# Patient Record
Sex: Male | Born: 1970
Health system: Southern US, Community
[De-identification: ages and names within clinical notes are randomized; demographics above are authoritative.]

## PROBLEM LIST (undated history)

## (undated) DIAGNOSIS — F329 Major depressive disorder, single episode, unspecified: Secondary | ICD-10-CM

## (undated) DIAGNOSIS — F419 Anxiety disorder, unspecified: Secondary | ICD-10-CM

## (undated) DIAGNOSIS — F32A Depression, unspecified: Secondary | ICD-10-CM

## (undated) DIAGNOSIS — J45909 Unspecified asthma, uncomplicated: Secondary | ICD-10-CM

## (undated) HISTORY — DX: Major depressive disorder, single episode, unspecified: F32.9

## (undated) HISTORY — DX: Anxiety disorder, unspecified: F41.9

## (undated) HISTORY — DX: Unspecified asthma, uncomplicated: J45.909

## (undated) HISTORY — DX: Depression, unspecified: F32.A

---

## 2013-10-06 ENCOUNTER — Ambulatory Visit: Payer: Self-pay | Admitting: Family Medicine

## 2013-10-06 VITALS — BP 120/66 | HR 97 | Temp 98.6°F | Resp 18 | Wt 145.0 lb

## 2013-10-06 DIAGNOSIS — S61319A Laceration without foreign body of unspecified finger with damage to nail, initial encounter: Secondary | ICD-10-CM

## 2013-10-06 DIAGNOSIS — S61209A Unspecified open wound of unspecified finger without damage to nail, initial encounter: Secondary | ICD-10-CM

## 2013-10-06 MED ORDER — CEPHALEXIN 500 MG PO CAPS: 500.0000 mg | ORAL_CAPSULE | Freq: Four times a day (QID) | ORAL | Status: DC

## 2013-10-06 NOTE — Progress Notes (Signed)
[facility]:  Office Visit  Chief Complaint:  Chief Complaint  Patient presents with  . Hand Pain    right middle finger pain    HPI: Dalton Peters is a 42 y.o. male who is here for right middle finger injury and possible wound infection, works in the Stage manager at [employer],  Staple gun went  into his nail bed, just on the top part and he pulled it out x 1 week. He denies any redness, welling, pain; No fevers or chills Noticed green coloration on cuticle and base of nail x 1 day and tried toclean it off but did not.  No drainage Tetanus UTD 2009  Past Medical History  Diagnosis Date  . Anxiety   . Depression    No past surgical history on file. History   Social History  . Marital Status: Divorced    Spouse Name: N/A    Number of Children: N/A  . Years of Education: N/A   Social History Main Topics  . Smoking status: Light Tobacco Smoker  . Smokeless tobacco: None  . Alcohol Use: Yes  . Drug Use: Yes    Special: Marijuana  . Sexual Activity: None   Other Topics Concern  . None   Social History Narrative  . None   No family history on file. No Known Allergies Prior to Admission medications   Medication Sig Start Date End Date Taking? Authorizing Provider  amphetamine-dextroamphetamine (ADDERALL XR) 20 MG 24 hr capsule Take 20 mg by mouth every morning.    Historical Provider, MD     ROS: The patient denies fevers, chills, night sweats, unintentional weight loss, chest pain, palpitations, wheezing, dyspnea on exertion, nausea, vomiting, abdominal pain, dysuria, hematuria, melena, numbness, weakness, or tingling.   All other systems have been reviewed and were otherwise negative with the exception of those mentioned in the HPI and as above.    PHYSICAL EXAM: Filed Vitals:   10/06/13 1617  BP: 120/66  Pulse: 97  Temp: 98.6 F (37 C)  Resp: 18   Filed Vitals:   10/06/13 1617  Weight: 145 lb (65.772 kg)   There is no height on file to  calculate BMI.  General: Alert, no acute distress HEENT:  Normocephalic, atraumatic, oropharynx patent. EOMI, PERRLA Cardiovascular:  Regular rate and rhythm, no rubs murmurs or gallops.  No Carotid bruits, radial pulse intact. No pedal edema.  Respiratory: Clear to auscultation bilaterally.  No wheezes, rales, or rhonchi.  No cyanosis, no use of accessory musculature GI: No organomegaly, abdomen is soft and non-tender, positive bowel sounds.  No masses. Skin: + right middle finger, + slightgreen tint bruise like coloration of cuticle, no e/o infection,there is a small pin point hole inderneath the area of interest,but no dc Neurologic: Facial musculature symmetric. Psychiatric: Patient is appropriate throughout our interaction. Lymphatic: No cervical lymphadenopathy Musculoskeletal: Gait intact. Neurovascularly intact: + radial pulse, good cap refill, + 5/5 strength deep and superficial tendons, sensation intact   LABS: No results found for this or any previous visit.   EKG/XRAY:   Primary read interpreted by Dr. Conley Rolls at [facility]   ASSESSMENT/PLAN: Encounter Diagnosis  Name Primary?  . Laceration of finger nail bed, initial encounter Yes   Scrubbed area clean, no xrays needed since he is neurovascularly intact, UTD on Tetanus No e/o current infection, may be a small resolving draining hematoma, there is a gap between nailbed and nail Will rx Keflexto use  if sxs worsen F/u  prn  Gross sideeffects, risk and benefits, and alternatives of medications d/w patient. Patient is aware that all medications have potential sideeffects and we are unable to predict every sideeffect or drug-drug interaction that may occur.  Hamilton Capri PHUONG, DO 10/06/2013 4:57 PM

## 2014-03-06 ENCOUNTER — Ambulatory Visit (INDEPENDENT_AMBULATORY_CARE_PROVIDER_SITE_OTHER): Payer: BC Managed Care – PPO | Admitting: Family Medicine

## 2014-03-06 VITALS — BP 122/74 | HR 103 | Temp 99.0°F | Resp 17 | Ht 67.5 in | Wt 149.0 lb

## 2014-03-06 DIAGNOSIS — F329 Major depressive disorder, single episode, unspecified: Secondary | ICD-10-CM

## 2014-03-06 DIAGNOSIS — F3289 Other specified depressive episodes: Secondary | ICD-10-CM

## 2014-03-06 DIAGNOSIS — F32A Depression, unspecified: Secondary | ICD-10-CM

## 2014-03-06 DIAGNOSIS — F640 Transsexualism: Secondary | ICD-10-CM

## 2014-03-06 DIAGNOSIS — F909 Attention-deficit hyperactivity disorder, unspecified type: Secondary | ICD-10-CM

## 2014-03-06 MED ORDER — BUPROPION HCL ER (XL) 150 MG PO TB24: 150.0000 mg | ORAL_TABLET | Freq: Every day | ORAL | Status: DC

## 2014-03-06 MED ORDER — AMPHETAMINE-DEXTROAMPHET ER 20 MG PO CP24: 20.0000 mg | ORAL_CAPSULE | ORAL | Status: DC

## 2014-03-06 NOTE — Progress Notes (Addendum)
Subjective:  Patient ID: [name], male    DOB: 12-Dec-1971, 43 y.o.   MRN: [id number] This chart was scribed for Shade Flood, MD by Marica Otter, ED Scribe at [facility]. This patient was seen in room Room 8 and the patient's care was started at 10:27AM.   Authored by Silas Sacramento, MD, unable to change in [facility].  PCP:  No PCP Per Patient  HPI HPI Comments: Dalton Peters is a 43 y.o. male, who presents to the [facility] requesting meds refill for Adderall XR and Wellbutrin.  Pt states that he has been on these meds on and off since the summer of 2010. Specifically, pt states that he discontinued use of his Wellbutrin approximately one year ago because he felt that the meds may be contributing to more mood swings/depressive symptoms. . Pt reports that since being off the meds, he has been having difficulty managing his daily activities, decrease in focus. Pt reports having fleeting thoughts of "if I was not here", however, denies making any plans or any specific intent regarding suicidal ideas. Pt further reports that  he has been contemplating transgender issues. Pt reports that recently he began to go by the name "Tab" which is short for Dalton Peters.    Pt denies palpitations, Hx of heart problems or family Hx of heart problems.   Plans on arranging counseling, pt reports he plans to reach out to the organization, [facility].    Pt is a smoker. Pt drinks 4-5 drinks per week. Pt reports he is a former marijuana user, now quit for a few weeks. Pt denies use of any other drugs.   Review of Systems  Constitutional: Negative for fatigue and unexpected weight change.  Eyes: Negative for visual disturbance.  Respiratory: Negative for cough, chest tightness and shortness of breath.   Cardiovascular: Negative for chest pain, palpitations and leg swelling.  Gastrointestinal: Negative for abdominal pain and blood in stool.  Neurological:  Negative for dizziness, light-headedness and headaches.  Psychiatric/Behavioral: Positive for suicidal ideas (fleeting thoughts of "if I was not here" but no specific intent or plan) and decreased concentration. Negative for self-injury.       Depression.    There are no active problems to display for this patient.  Past Medical History  Diagnosis Date   Anxiety    Depression    No past surgical history on file. No Known Allergies Prior to Admission medications   Medication Sig Start Date End Date Taking? Authorizing Provider  amphetamine-dextroamphetamine (ADDERALL XR) 20 MG 24 hr capsule Take 20 mg by mouth every morning.   Yes Historical Provider, MD   History   Social History   Marital Status: Divorced    Spouse Name: N/A    Number of Children: N/A   Years of Education: N/A   Occupational History   Not on file.   Social History Main Topics   Smoking status: Light Tobacco Smoker   Smokeless tobacco: Not on file   Alcohol Use: Yes   Drug Use: Yes    Special: Marijuana   Sexual Activity: Not on file   Other Topics Concern   Not on file   Social History Narrative   No narrative on file   Objective:  Physical Exam  Nursing note and vitals reviewed. Constitutional: He is oriented to person, place, and time. He appears well-developed and well-nourished.  HENT:  Head: Normocephalic and atraumatic.  Right  Ear: Tympanic membrane, external ear and ear canal normal.  Left Ear: Tympanic membrane, external ear and ear canal normal.  Nose: No rhinorrhea.  Mouth/Throat: Oropharynx is clear and moist and mucous membranes are normal. No oropharyngeal exudate or posterior oropharyngeal erythema.  Eyes: Conjunctivae are normal. Pupils are equal, round, and reactive to light.  Neck: Neck supple.  Cardiovascular: Normal rate, regular rhythm, normal heart sounds and intact distal pulses.   No murmur heard. Pulmonary/Chest: Effort normal and breath sounds normal. He  has no wheezes. He has no rhonchi. He has no rales.  Abdominal: Soft. There is no tenderness.  Lymphadenopathy:    He has no cervical adenopathy.  Neurological: He is alert and oriented to person, place, and time.  Skin: Skin is warm and dry. No rash noted.  Psychiatric: He has a normal mood and affect. His behavior is normal. Judgment and thought content normal.   Filed Vitals:   03/06/14 0932  BP: 122/74  Pulse: 103  Temp: 99 F (37.2 C)  TempSrc: Oral  Resp: 17  Height: 5' 7.5" (1.715 m)  Weight: 149 lb (67.586 kg)  SpO2: 98%   Controlled Substance Database Report: Last entered listing on May 27, 2013 for amphetamine salts 20 mg  #30.  Assessment & Plan:   Marchia Bondhomas M Frieze is a 43 y.o. male Depression - Plan: buPROPion (WELLBUTRIN XL) 150 MG 24 hr tablet restarted. May need higher dose.He plans to arrange therapist, and with gender identity disorder, and reported OCD type sx's - may need SSRi - higher dose, or psychiatrist to help with medication mgt including ADHD mgt, but will try prior regimen initially, and recheck in next 4-6 weeks.  RTC/ER precautions including any suicidal thoughts/plan with starting meds discussed.    Attention deficit hyperactivity disorder (ADHD) - restart Adderall if needed during week. Recheck next 4-6 weeks.  2 months meds rx.   Meds ordered this encounter  Medications   DISCONTD: amphetamine-dextroamphetamine (ADDERALL XR) 20 MG 24 hr capsule    Sig: Take 1 capsule (20 mg total) by mouth every morning.    Dispense:  30 capsule    Refill:  0   amphetamine-dextroamphetamine (ADDERALL XR) 20 MG 24 hr capsule    Sig: Take 1 capsule (20 mg total) by mouth every morning.    Dispense:  30 capsule    Refill:  0    May fill 30 days after date on prescription.   buPROPion (WELLBUTRIN XL) 150 MG 24 hr tablet    Sig: Take 1 tablet (150 mg total) by mouth daily.    Dispense:  30 tablet    Refill:  2   Patient Instructions  Start wellbutrin at  150mg  each day.  We may need to increase this, but can discuss at follow up.  If any increased depression, or any suicide thoughts - return here or emergency room immediately. Adderall in the morning during week if needed for ADD.  Arrange therapist appointment as discussed, but et me know if other names needed beside [facility]. Return to the clinic or go to the nearest emergency room if any of your symptoms worsen or new symptoms occur. Recheck in next 4-6 weeks.   [facility] Policy for Prescribing Controlled Substances (Revised 10/2012) 1. Prescriptions for controlled substances will be filled by ONE provider at [facility] with whom you have established and developed a plan for your care, including follow-up. 2. You are encouraged to schedule an appointment with your prescriber at our appointment  center for follow-up visits whenever possible. 3. If you request a prescription for the controlled substance while at [facility] for an acute problem (with someone other than your regular prescriber), you MAY be given a ONE-TIME prescription for a 30-day supply of the controlled substance, to allow time for you to return to see your regular prescriber for additional prescriptions.    I personally performed the services described in this documentation, which was scribed in my presence. The recorded information has been reviewed and considered, and addended by me as needed.

## 2014-03-06 NOTE — Patient Instructions (Signed)
Start wellbutrin at 150mg  each day.  We may need to increase this, but can discuss at follow up.  If any increased depression, or any suicide thoughts - return here or emergency room immediately. Adderall in the morning during week if needed for ADD.  Arrange therapist appointment as discussed, but et me know if other names needed beside [facility]. Return to the clinic or go to the nearest emergency room if any of your symptoms worsen or new symptoms occur. Recheck in next 4-6 weeks.   [facility] Policy for Prescribing Controlled Substances (Revised 10/2012) 1. Prescriptions for controlled substances will be filled by ONE provider at [facility] with whom you have established and developed a plan for your care, including follow-up. 2. You are encouraged to schedule an appointment with your prescriber at our appointment center for follow-up visits whenever possible. 3. If you request a prescription for the controlled substance while at [facility] for an acute problem (with someone other than your regular prescriber), you MAY be given a ONE-TIME prescription for a 30-day supply of the controlled substance, to allow time for you to return to see your regular prescriber for additional prescriptions.

## 2014-03-15 NOTE — Progress Notes (Signed)
Dr. Neva SeatGreene schedule is full,  Will let Selena BattenKim schedule this appointment

## 2014-03-24 ENCOUNTER — Telehealth: Payer: Self-pay

## 2014-03-24 NOTE — Telephone Encounter (Signed)
Patient called stated he seen Dr. Neva SeatGreene. He is requesting to change his Adderall to the instant release instead of the extended release.

## 2014-06-19 ENCOUNTER — Other Ambulatory Visit: Payer: Self-pay | Admitting: Family Medicine

## 2014-06-23 ENCOUNTER — Ambulatory Visit (INDEPENDENT_AMBULATORY_CARE_PROVIDER_SITE_OTHER): Payer: BC Managed Care – PPO | Admitting: Family Medicine

## 2014-06-23 VITALS — BP 108/60 | HR 80 | Temp 98.1°F | Resp 16 | Ht 66.25 in | Wt 134.4 lb

## 2014-06-23 DIAGNOSIS — F988 Other specified behavioral and emotional disorders with onset usually occurring in childhood and adolescence: Secondary | ICD-10-CM

## 2014-06-23 DIAGNOSIS — L299 Pruritus, unspecified: Secondary | ICD-10-CM

## 2014-06-23 MED ORDER — AMPHETAMINE-DEXTROAMPHETAMINE 20 MG PO TABS: ORAL_TABLET | ORAL | Status: DC

## 2014-06-23 MED ORDER — TRIAMCINOLONE ACETONIDE 0.1 % EX CREA: 1.0000 "application " | TOPICAL_CREAM | Freq: Two times a day (BID) | CUTANEOUS | Status: DC

## 2014-06-23 MED ORDER — AMPHETAMINE-DEXTROAMPHETAMINE 20 MG PO TABS: 20.0000 mg | ORAL_TABLET | Freq: Every day | ORAL | Status: DC

## 2014-06-23 NOTE — Progress Notes (Signed)
[facility] [address], [place] [address] [phone]  Date:  06/23/2014   Name:  Dalton Peters   DOB:  1971/01/18   MRN:  [id number]  PCP:  No PCP Per Patient    Chief Complaint: Medication and Rash   History of Present Illness:  Dalton Peters is a [age] y.o. very pleasant male patient who presents with the following:  Needs adderall refill and to discuss a rash.  He would like to change his adderall- he had tried the XR, but felt that he did better on the regular release in the past.  He would like to go back to regular if possible.  He had taken 20mg  IR in the morning, with an occasional afternoon second dose depending on his activities   Also, he has a rash that he noted 2-3 days ago.  He first noted it on his left trunk, and now has some on his arms and face as well. It is mostly on his waist, crotch, and his nose/ neck/tiny spots on his arms.  He thinks it is likely PI.  He is using OTC itch and "ivyrest" creams.    Otherwise he is ok today, generally healthy   He has done a little yard work.    Patient Active Problem List   Diagnosis Date Noted  . Depression 03/06/2014  . Attention deficit hyperactivity disorder (ADHD) 03/06/2014  . Gender identity disorder in adult 03/06/2014    Past Medical History  Diagnosis Date  . Anxiety   . Depression     No past surgical history on file.  History  Substance Use Topics  . Smoking status: Former Smoker    Quit date: 10/22/2013  . Smokeless tobacco: Not on file  . Alcohol Use: 1.0 - 1.5 oz/week    2-3 drink(s) per week    History reviewed. No pertinent family history.  No Known Allergies  Medication list has been reviewed and updated.  Current Outpatient Prescriptions on File Prior to Visit  Medication Sig Dispense Refill  . amphetamine-dextroamphetamine (ADDERALL XR) 20 MG 24 hr capsule Take 1 capsule (20 mg total) by mouth every morning.  30 capsule  0  . buPROPion (WELLBUTRIN XL) 150  MG 24 hr tablet Take 1 tablet (150 mg total) by mouth daily. PATIENT NEEDS OFFICE VISIT FOR ADDITIONAL REFILLS - OVERDUE FOR FOLLOW UP.  30 tablet  0   No current facility-administered medications on file prior to visit.    Review of Systems:  As per HPI- otherwise negative.   Physical Examination: Filed Vitals:   06/23/14 1438  BP: 108/60  Pulse: 80  Temp: 98.1 F (36.7 C)  Resp: 16   Filed Vitals:   06/23/14 1438  Height: 5' 6.25" (1.683 m)  Weight: 134 lb 6.4 oz (60.963 kg)   Body mass index is 21.52 kg/(m^2). Ideal Body Weight: Weight in (lb) to have BMI = 25: 155.7  GEN: WDWN, NAD, Non-toxic, A & O x 3, slim, looks well HEENT: Atraumatic, Normocephalic. Neck supple. No masses, No LAD. Ears and Nose: No external deformity. CV: RRR, No M/G/R. No JVD. No thrill. No extra heart sounds. PULM: CTA B, no wheezes, crackles, rhonchi. No retractions. No resp. distress. No accessory muscle use. EXTR: No c/c/e NEURO Normal gait.  PSYCH: Normally interactive. Conversant. Not depressed or anxious appearing.  Calm demeanor.  He has mild rhus dermatitis over his left waistline, neck and chin, and a tiny spot on his  nose  Assessment and Plan: ADD (attention deficit disorder) - Plan: amphetamine-dextroamphetamine (ADDERALL) 20 MG tablet, amphetamine-dextroamphetamine (ADDERALL) 20 MG tablet, amphetamine-dextroamphetamine (ADDERALL) 20 MG tablet  Itching - Plan: triamcinolone cream (KENALOG) 0.1 %  Changed his adderall to 20mg  IR, gave #40 for a month so he can take a 2nd dose on occasion if needed Discussed treatment for PI.  He is not eager to take steroids and I agree topicals should be sufficient.  Will treat with triamcinoone, he will let me know if his sx get worse and he needs oral pred.  He will let me know if the new adderall regimen is not satisfactory   Signed Abbe AmsterdamJessica Conita Amenta, MD

## 2014-06-23 NOTE — Patient Instructions (Signed)
Let's try changing to adderall regular release 20mg , you can take a 2nd dose later in the day as needed.    Use the triamcinolone cream on your rash.  You can also try zyrtec or claritin, and benadryl at night as needed.  If your symptoms get worse and you need prednisone call me

## 2014-07-18 ENCOUNTER — Other Ambulatory Visit: Payer: Self-pay | Admitting: Family Medicine

## 2014-07-18 DIAGNOSIS — F329 Major depressive disorder, single episode, unspecified: Secondary | ICD-10-CM

## 2014-07-18 DIAGNOSIS — F32A Depression, unspecified: Secondary | ICD-10-CM

## 2014-07-19 NOTE — Telephone Encounter (Signed)
Dr Patsy Lageropland, I put note on last RF that pt needed OV for more. He came in and saw you on 06/23/14 but didn't discuss depression that I can see. Do you want to give RFs or RTC?

## 2014-10-09 ENCOUNTER — Ambulatory Visit (INDEPENDENT_AMBULATORY_CARE_PROVIDER_SITE_OTHER): Payer: BC Managed Care – PPO | Admitting: Emergency Medicine

## 2014-10-09 VITALS — BP 112/66 | HR 77 | Temp 98.5°F | Resp 18 | Ht 67.5 in | Wt 144.0 lb

## 2014-10-09 DIAGNOSIS — F909 Attention-deficit hyperactivity disorder, unspecified type: Secondary | ICD-10-CM

## 2014-10-09 DIAGNOSIS — M79622 Pain in left upper arm: Secondary | ICD-10-CM

## 2014-10-09 DIAGNOSIS — F988 Other specified behavioral and emotional disorders with onset usually occurring in childhood and adolescence: Secondary | ICD-10-CM

## 2014-10-09 MED ORDER — AMPHETAMINE-DEXTROAMPHETAMINE 20 MG PO TABS: 20.0000 mg | ORAL_TABLET | Freq: Every day | ORAL | Status: DC

## 2014-10-09 MED ORDER — NAPROXEN SODIUM 550 MG PO TABS: 550.0000 mg | ORAL_TABLET | Freq: Two times a day (BID) | ORAL | Status: AC

## 2014-10-09 NOTE — Progress Notes (Signed)
[facility] [address], [place] [address] [phone]  Date:  10/09/2014   Name:  Dalton Peters Rasp   DOB:  18-Nov-1971   MRN:  [id number]  PCP:  No PCP Per Patient    Chief Complaint: Arm Pain   History of Present Illness:  Dalton Peters Dalton Peters is a 43 y.o. very pleasant male patient who presents with the following:  Has pain in left upper arm for the past 6 months.  Pain is intermittent and has been more or less constant for the past two weeks with increase use at work.   No numbness, tingling or neuro symptoms No history of injury. Pain in biceps, not in shoulder or elbow. No radiation. History of bursitis in shoulder and this is "different". No improvement with over the counter medications or other home remedies. Denies other complaint or health concern today.   Patient Active Problem List   Diagnosis Date Noted  . Depression 03/06/2014  . Attention deficit hyperactivity disorder (ADHD) 03/06/2014  . Gender identity disorder in adult 03/06/2014    Past Medical History  Diagnosis Date  . Anxiety   . Depression     History reviewed. No pertinent past surgical history.  History  Substance Use Topics  . Smoking status: Former Smoker    Quit date: 10/22/2013  . Smokeless tobacco: Not on file  . Alcohol Use: 1.0 - 1.5 oz/week    2-3 drink(s) per week    History reviewed. No pertinent family history.  No Known Allergies  Medication list has been reviewed and updated.  Current Outpatient Prescriptions on File Prior to Visit  Medication Sig Dispense Refill  . amphetamine-dextroamphetamine (ADDERALL) 20 MG tablet Take 1 tablet (20 mg total) by mouth daily. Take a second dose in the afternoon if needed  40 tablet  0  . buPROPion (WELLBUTRIN XL) 150 MG 24 hr tablet Take 1 tablet (150 mg total) by mouth daily.  30 tablet  2  . amphetamine-dextroamphetamine (ADDERALL) 20 MG tablet Take 1 tablet by mouth daily.  Take a second dose in the afternoon  if needed.  Ok to fill 07/23/2014  40 tablet  0  . amphetamine-dextroamphetamine (ADDERALL) 20 MG tablet Take 1 tablet by mouth daily.  Take a second dose in the afternoon if needed.  Ok to fill 08/22/2014  40 tablet  0  . triamcinolone cream (KENALOG) 0.1 % Apply 1 application topically 2 (two) times daily.  60 g  0   No current facility-administered medications on file prior to visit.    Review of Systems:   As per HPI, otherwise negative.    Physical Examination: Filed Vitals:   10/09/14 1613  BP: 112/66  Pulse: 77  Temp: 98.5 F (36.9 C)  Resp: 18   Filed Vitals:   10/09/14 1613  Height: 5' 7.5" (1.715 Peters)  Weight: 144 lb (65.318 kg)   Body mass index is 22.21 kg/(Peters^2). Ideal Body Weight: Weight in (lb) to have BMI = 25: 161.7   GEN: WDWN, NAD, Non-toxic, Alert & Oriented x 3 HEENT: Atraumatic, Normocephalic.  Ears and Nose: No external deformity. EXTR: No clubbing/cyanosis/edema NEURO: Normal gait.  PSYCH: Normally interactive. Conversant. Not depressed or anxious appearing.  Calm demeanor.  LEFT upper ext:  Full painless AROM shoulder and elbow.  No tenderness shoulder, upper arm or elbow.   Assessment and Plan: Cervical neuritis vs muscle strain Anaprox  Signed,  Phillips OdorJeffery Taraann Olthoff, MD

## 2014-10-09 NOTE — Addendum Note (Signed)
Addended by: Carmelina DaneANDERSON, Payton Prinsen S on: 10/09/2014 04:29 PM   Modules accepted: Orders

## 2014-10-21 ENCOUNTER — Other Ambulatory Visit: Payer: Self-pay | Admitting: Family Medicine

## 2014-11-07 ENCOUNTER — Ambulatory Visit (INDEPENDENT_AMBULATORY_CARE_PROVIDER_SITE_OTHER): Payer: BC Managed Care – PPO | Admitting: Family Medicine

## 2014-11-07 ENCOUNTER — Telehealth: Payer: Self-pay | Admitting: Urgent Care

## 2014-11-07 ENCOUNTER — Ambulatory Visit (INDEPENDENT_AMBULATORY_CARE_PROVIDER_SITE_OTHER): Payer: BC Managed Care – PPO

## 2014-11-07 VITALS — BP 104/70 | HR 82 | Temp 97.7°F | Resp 16 | Ht 66.0 in | Wt 142.0 lb

## 2014-11-07 DIAGNOSIS — F32A Depression, unspecified: Secondary | ICD-10-CM

## 2014-11-07 DIAGNOSIS — F329 Major depressive disorder, single episode, unspecified: Secondary | ICD-10-CM

## 2014-11-07 DIAGNOSIS — S93509A Unspecified sprain of unspecified toe(s), initial encounter: Secondary | ICD-10-CM

## 2014-11-07 DIAGNOSIS — M25511 Pain in right shoulder: Secondary | ICD-10-CM

## 2014-11-07 DIAGNOSIS — S99922A Unspecified injury of left foot, initial encounter: Secondary | ICD-10-CM

## 2014-11-07 DIAGNOSIS — F419 Anxiety disorder, unspecified: Secondary | ICD-10-CM

## 2014-11-07 MED ORDER — BUPROPION HCL ER (SR) 150 MG PO TB12: 150.0000 mg | ORAL_TABLET | Freq: Two times a day (BID) | ORAL | Status: DC

## 2014-11-07 MED ORDER — MELOXICAM 15 MG PO TABS: 15.0000 mg | ORAL_TABLET | Freq: Every day | ORAL | Status: DC

## 2014-11-07 NOTE — Progress Notes (Signed)
MRN: [id number] DOB: Jul 18, 1971  Subjective:   Dalton Peters is a 43 y.o. male presenting for foot injury and mental fog.  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 Patient wears working boots hit feet at an angle, left foot Peters, rolled right foot. Also hit left elbow, feels like he strained his right shoulder trying to catch himself on the way down. Patient was able to walk immediately afterwards without pain. Mild - moderate pain in his left 2nd toe started a couple of hours later, relieved some with Naproxen. Had some redness/purple discoloration last night, improved today. Denies bleeding, frank swelling. Reports good ROM, denies loss of sensation, not tender to touch. Patient is really worried that he has a broken toe, states he broke same toe years ago in right foot and feels exactly the same as this. Also, elevated right ankle, used ice, some soreness in right ankle. Reports right shoulder has limited range of motion and pain. No deformity, swelling or erythema for ankle or shoulder.  Feels like mental fog in the last week, having a lot of anxiety, depression due to finances, paying bills. Reports being on Wellbutrin since 02/2014 for ADHD, takes Adderall intermittently for days when he is really busy up to 20mg  in a day. Sleep is "okay", does not have regular work schedule, sleep is inconsistent, tries Melatonin to help with this. Has difficulty concentrating, feels agitated/restless, appetite has been good, admits passive thoughts about death, "he'd be 'okay' if he got hit by a bus" but no SI or HI. Denies a history of seizures or adverse effects with Wellbutrin. Denies fevers, chills, n/v, diarrhea, weight loss, fatigue, constipation. Denies smoking, smoked marijuana 2 weeks ago, has phases with this not using as much lately, does not plan on smoking marijuana again since he is trying to get a new  job, drinks alcohol occasionally.  Denies any other aggravating or relieving factors, no other questions or concerns.   Prior to Admission medications   Medication Sig Start Date End Date Taking? Authorizing Provider  amphetamine-dextroamphetamine (ADDERALL) 20 MG tablet Take 1 tablet by mouth daily.  Take a second dose in the afternoon if needed.  Ok to fill 07/23/2014 06/23/14  Yes Jessica C Copland, MD  amphetamine-dextroamphetamine (ADDERALL) 20 MG tablet Take 1 tablet by mouth daily.  Take a second dose in the afternoon if needed.  Ok to fill 08/22/2014 06/23/14  Yes Jessica C Copland, MD  amphetamine-dextroamphetamine (ADDERALL) 20 MG tablet Take 1 tablet (20 mg total) by mouth daily. Take a second dose in the afternoon if needed 10/09/14  Yes Carmelina DaneJeffery S Anderson, MD  buPROPion (WELLBUTRIN XL) 150 MG 24 hr tablet TAKE 1 TABLET (150 MG TOTAL) BY MOUTH DAILY. 10/23/14  Yes Gwenlyn FoundJessica C Copland, MD  naproxen sodium (ANAPROX DS) 550 MG tablet Take 1 tablet (550 mg total) by mouth 2 (two) times daily with a meal. 10/09/14 10/09/15 Yes Carmelina DaneJeffery S Anderson, MD  triamcinolone cream (KENALOG) 0.1 % Apply 1 application topically 2 (two) times daily. 06/23/14   Pearline CablesJessica C Copland, MD    No Known Allergies   Past Medical History  Diagnosis Date  . Anxiety   . Depression     History reviewed. No pertinent past surgical history.   ROS As in subjective.   Objective:   Vitals: BP 104/70 mmHg  Pulse 82  Temp(Src) 97.7 F (36.5 C) (Oral)  Resp 16  Ht 5\' 6"  (1.676 m)  Wt 142 lb (64.411 kg)  BMI 22.93 kg/m2  SpO2 98%  Physical Exam  Constitutional: He is oriented to person, place, and time and well-developed, well-nourished, and in no distress. No distress.  Neck: Normal range of motion. Neck supple. No thyromegaly present.  Cardiovascular: Normal rate, regular rhythm, normal heart sounds and intact distal pulses.  Exam reveals no gallop and no friction rub.   No murmur heard. Pulmonary/Chest:  Effort normal and breath sounds normal. No stridor. No respiratory distress. He has no wheezes. He has no rales. He exhibits no tenderness.  Abdominal: Soft. Bowel sounds are normal. He exhibits no distension and no mass. There is no tenderness.  Musculoskeletal: He exhibits edema and tenderness.       Right shoulder: He exhibits decreased range of motion and pain (Positive Hawkins and Neers). He exhibits no bony tenderness, no swelling and no deformity.       Left ankle: He exhibits decreased range of motion (left 2nd toe), swelling (left 2nd toe) and ecchymosis (mild over left 2nd toe). Tenderness (left 2nd toe).  Neurological: He is alert and oriented to person, place, and time.  Skin: Skin is warm and dry. No rash noted. He is not diaphoretic. No erythema.   [facility] reading (PRIMARY) by  Dr. Patsy Lageropland and PA-Marcques Wrightsman. [facility]. Unable to enhance image or zoom in. Suspect non-displaced, closed proximal fracture of proximal phalange in 2nd left toe. No other bony abnormalities, soft tissue unremarkable.   Assessment and Plan :   1. Toe fracture, left, closed, initial encounter 2. Toe injury, left, initial encounter - Immobilization through buddy tape of 2nd and 3rd left toe, advised patient to continue this for 2 weeks since pain is very minimal and is ambulating very well. Advised return to clinic if symptoms worsen, fail to resolve or as needed.  - DG Foot 2 Views Left; Future - meloxicam (MOBIC) 15 MG tablet; Take 1 tablet (15 mg total) by mouth daily.  Dispense: 30 tablet; Refill: 1  3. Pain in joint, shoulder region, right Shoulder impingement, possible shoulder strain, advised shoulder and stretching exercises, Meloxicam for pain, return to clinic if symptoms worsen, fail to resolve or as needed - meloxicam (MOBIC) 15 MG tablet; Take 1 tablet (15 mg total) by mouth daily.  Dispense: 30 tablet; Refill: 1  4. Depression 5. Anxiety - Changed and increased from  Wellbutrin XL 150mg  QD to 150mg  Wellbutrin SR BID, follow up in 4 weeks. Advised patient to call and request my schedule during 1st week of December for follow up. - buPROPion (WELLBUTRIN SR) 150 MG 12 hr tablet; Take 1 tablet (150 mg total) by mouth 2 (two) times daily.  Dispense: 60 tablet; Refill: 1   Wallis BambergMario Martese Vanatta, PA-C [facility] [phone] 11/07/2014 10:51 AM

## 2014-11-07 NOTE — Patient Instructions (Signed)
Toe Fracture Your caregiver has diagnosed you as having a fractured toe. A toe fracture is a break in the bone of a toe. "Buddy taping" is a way of splinting your broken toe, by taping the broken toe to the toe next to it. This "buddy taping" will keep the injured toe from moving beyond normal range of motion. Buddy taping also helps the toe heal in a more normal alignment. It may take 6 to 8 weeks for the toe injury to heal. HOME CARE INSTRUCTIONS   Leave your toes taped together for as long as directed by your caregiver or until you see a doctor for a follow-up examination. You can change the tape after bathing. Always use a small piece of gauze or cotton between the toes when taping them together. This will help the skin stay dry and prevent infection.  Apply ice to the injury for 15-20 minutes each hour while awake for the first 2 days. Put the ice in a plastic bag and place a towel between the bag of ice and your skin.  After the first 2 days, apply heat to the injured area. Use heat for the next 2 to 3 days. Place a heating pad on the foot or soak the foot in warm water as directed by your caregiver.  Keep your foot elevated as much as possible to lessen swelling.  Wear sturdy, supportive shoes. The shoes should not pinch the toes or fit tightly against the toes.  Your caregiver may prescribe a rigid shoe if your foot is very swollen.  Your may be given crutches if the pain is too great and it hurts too much to walk.  Only take over-the-counter or prescription medicines for pain, discomfort, or fever as directed by your caregiver.  If your caregiver has given you a follow-up appointment, it is very important to keep that appointment. Not keeping the appointment could result in a chronic or permanent injury, pain, and disability. If there is any problem keeping the appointment, you must call back to this facility for assistance. SEEK MEDICAL CARE IF:   You have increased pain or swelling,  not relieved with medications.  The pain does not get better after 1 week.  Your injured toe is cold when the others are warm. SEEK IMMEDIATE MEDICAL CARE IF:   The toe becomes cold, numb, or white.  The toe becomes hot (inflamed) and red. Document Released: 12/05/2000 Document Revised: 03/01/2012 Document Reviewed: 07/24/2008 [facility] Patient Information 2015 [place], [place]. This information is not intended to replace advice given to you by your health care provider. Make sure you discuss any questions you have with your health care provider.    Perform shoulder exercises and stretches twice a day, 5 sets of 10 repetitions. Take Meloxicam for pain.    Impingement Syndrome, Rotator Cuff, Bursitis with Rehab Impingement syndrome is a condition that involves inflammation of the tendons of the rotator cuff and the subacromial bursa, that causes pain in the shoulder. The rotator cuff consists of four tendons and muscles that control much of the shoulder and upper arm function. The subacromial bursa is a fluid filled sac that helps reduce friction between the rotator cuff and one of the bones of the shoulder (acromion). Impingement syndrome is usually an overuse injury that causes swelling of the bursa (bursitis), swelling of the tendon (tendonitis), and/or a tear of the tendon (strain). Strains are classified into three categories. Grade 1 strains cause pain, but the tendon is not lengthened. Grade  2 strains include a lengthened ligament, due to the ligament being stretched or partially ruptured. With grade 2 strains there is still function, although the function may be decreased. Grade 3 strains include a complete tear of the tendon or muscle, and function is usually impaired. SYMPTOMS   Pain around the shoulder, often at the outer portion of the upper arm.  Pain that gets worse with shoulder function, especially when reaching overhead or lifting.  Sometimes, aching when not using the  arm.  Pain that wakes you up at night.  Sometimes, tenderness, swelling, warmth, or redness over the affected area.  Loss of strength.  Limited motion of the shoulder, especially reaching behind the back (to the back pocket or to unhook bra) or across your body.  Crackling sound (crepitation) when moving the arm.  Biceps tendon pain and inflammation (in the front of the shoulder). Worse when bending the elbow or lifting. CAUSES  Impingement syndrome is often an overuse injury, in which chronic (repetitive) motions cause the tendons or bursa to become inflamed. A strain occurs when a force is paced on the tendon or muscle that is greater than it can withstand. Common mechanisms of injury include: Stress from sudden increase in duration, frequency, or intensity of training.  Direct hit (trauma) to the shoulder.  Aging, erosion of the tendon with normal use.  Bony bump on shoulder (acromial spur). RISK INCREASES WITH:  Contact sports (football, wrestling, boxing).  Throwing sports (baseball, tennis, volleyball).  Weightlifting and bodybuilding.  Heavy labor.  Previous injury to the rotator cuff, including impingement.  Poor shoulder strength and flexibility.  Failure to warm up properly before activity.  Inadequate protective equipment.  Old age.  Bony bump on shoulder (acromial spur). PREVENTION   Warm up and stretch properly before activity.  Allow for adequate recovery between workouts.  Maintain physical fitness:  Strength, flexibility, and endurance.  Cardiovascular fitness.  Learn and use proper exercise technique. PROGNOSIS  If treated properly, impingement syndrome usually goes away within 6 weeks. Sometimes surgery is required.  RELATED COMPLICATIONS   Longer healing time if not properly treated, or if not given enough time to heal.  Recurring symptoms, that result in a chronic condition.  Shoulder stiffness, frozen shoulder, or loss of  motion.  Rotator cuff tendon tear.  Recurring symptoms, especially if activity is resumed too soon, with overuse, with a direct blow, or when using poor technique. TREATMENT  Treatment first involves the use of ice and medicine, to reduce pain and inflammation. The use of strengthening and stretching exercises may help reduce pain with activity. These exercises may be performed at home or with a therapist. If non-surgical treatment is unsuccessful after more than 6 months, surgery may be advised. After surgery and rehabilitation, activity is usually possible in 3 months.  MEDICATION  If pain medicine is needed, nonsteroidal anti-inflammatory medicines (aspirin and ibuprofen), or other minor pain relievers (acetaminophen), are often advised.  Do not take pain medicine for 7 days before surgery.  Prescription pain relievers may be given, if your caregiver thinks they are needed. Use only as directed and only as much as you need.  Corticosteroid injections may be given by your caregiver. These injections should be reserved for the most serious cases, because they may only be given a certain number of times. HEAT AND COLD  Cold treatment (icing) should be applied for 10 to 15 minutes every 2 to 3 hours for inflammation and pain, and immediately after activity that aggravates  your symptoms. Use ice packs or an ice massage.  Heat treatment may be used before performing stretching and strengthening activities prescribed by your caregiver, physical therapist, or athletic trainer. Use a heat pack or a warm water soak. SEEK MEDICAL CARE IF:   Symptoms get worse or do not improve in 4 to 6 weeks, despite treatment.  New, unexplained symptoms develop. (Drugs used in treatment may produce side effects.) EXERCISES  RANGE OF MOTION (ROM) AND STRETCHING EXERCISES - Impingement Syndrome (Rotator Cuff  Tendinitis, Bursitis) These exercises may help you when beginning to rehabilitate your injury. Your  symptoms may go away with or without further involvement from your physician, physical therapist or athletic trainer. While completing these exercises, remember:   Restoring tissue flexibility helps normal motion to return to the joints. This allows healthier, less painful movement and activity.  An effective stretch should be held for at least 30 seconds.  A stretch should never be painful. You should only feel a gentle lengthening or release in the stretched tissue. STRETCH - Flexion, Standing  Stand with good posture. With an underhand grip on your right / left hand, and an overhand grip on the opposite hand, grasp a broomstick or cane so that your hands are a little more than shoulder width apart.  Keeping your right / left elbow straight and shoulder muscles relaxed, push the stick with your opposite hand, to raise your right / left arm in front of your body and then overhead. Raise your arm until you feel a stretch in your right / left shoulder, but before you have increased shoulder pain.  Try to avoid shrugging your right / left shoulder as your arm rises, by keeping your shoulder blade tucked down and toward your mid-back spine. Hold for __________ seconds.  Slowly return to the starting position. Repeat __________ times. Complete this exercise __________ times per day. STRETCH - Abduction, Supine  Lie on your back. With an underhand grip on your right / left hand and an overhand grip on the opposite hand, grasp a broomstick or cane so that your hands are a little more than shoulder width apart.  Keeping your right / left elbow straight and your shoulder muscles relaxed, push the stick with your opposite hand, to raise your right / left arm out to the side of your body and then overhead. Raise your arm until you feel a stretch in your right / left shoulder, but before you have increased shoulder pain.  Try to avoid shrugging your right / left shoulder as your arm rises, by keeping  your shoulder blade tucked down and toward your mid-back spine. Hold for __________ seconds.  Slowly return to the starting position. Repeat __________ times. Complete this exercise __________ times per day. ROM - Flexion, Active-Assisted  Lie on your back. You may bend your knees for comfort.  Grasp a broomstick or cane so your hands are about shoulder width apart. Your right / left hand should grip the end of the stick, so that your hand is positioned "thumbs-up," as if you were about to shake hands.  Using your healthy arm to lead, raise your right / left arm overhead, until you feel a gentle stretch in your shoulder. Hold for __________ seconds.  Use the stick to assist in returning your right / left arm to its starting position. Repeat __________ times. Complete this exercise __________ times per day.  ROM - Internal Rotation, Supine   Lie on your back on a firm surface.  Place your right / left elbow about 60 degrees away from your side. Elevate your elbow with a folded towel, so that the elbow and shoulder are the same height.  Using a broomstick or cane and your strong arm, pull your right / left hand toward your body until you feel a gentle stretch, but no increase in your shoulder pain. Keep your shoulder and elbow in place throughout the exercise.  Hold for __________ seconds. Slowly return to the starting position. Repeat __________ times. Complete this exercise __________ times per day. STRETCH - Internal Rotation  Place your right / left hand behind your back, palm up.  Throw a towel or belt over your opposite shoulder. Grasp the towel with your right / left hand.  While keeping an upright posture, gently pull up on the towel, until you feel a stretch in the front of your right / left shoulder.  Avoid shrugging your right / left shoulder as your arm rises, by keeping your shoulder blade tucked down and toward your mid-back spine.  Hold for __________ seconds. Release the  stretch, by lowering your healthy hand. Repeat __________ times. Complete this exercise __________ times per day. ROM - Internal Rotation   Using an underhand grip, grasp a stick behind your back with both hands.  While standing upright with good posture, slide the stick up your back until you feel a mild stretch in the front of your shoulder.  Hold for __________ seconds. Slowly return to your starting position. Repeat __________ times. Complete this exercise __________ times per day.  STRETCH - Posterior Shoulder Capsule   Stand or sit with good posture. Grasp your right / left elbow and draw it across your chest, keeping it at the same height as your shoulder.  Pull your elbow, so your upper arm comes in closer to your chest. Pull until you feel a gentle stretch in the back of your shoulder.  Hold for __________ seconds. Repeat __________ times. Complete this exercise __________ times per day. STRENGTHENING EXERCISES - Impingement Syndrome (Rotator Cuff Tendinitis, Bursitis) These exercises may help you when beginning to rehabilitate your injury. They may resolve your symptoms with or without further involvement from your physician, physical therapist or athletic trainer. While completing these exercises, remember:  Muscles can gain both the endurance and the strength needed for everyday activities through controlled exercises.  Complete these exercises as instructed by your physician, physical therapist or athletic trainer. Increase the resistance and repetitions only as guided.  You may experience muscle soreness or fatigue, but the pain or discomfort you are trying to eliminate should never worsen during these exercises. If this pain does get worse, stop and make sure you are following the directions exactly. If the pain is still present after adjustments, discontinue the exercise until you can discuss the trouble with your clinician.  During your recovery, avoid activity or  exercises which involve actions that place your injured hand or elbow above your head or behind your back or head. These positions stress the tissues which you are trying to heal. STRENGTH - Scapular Depression and Adduction   With good posture, sit on a firm chair. Support your arms in front of you, with pillows, arm rests, or on a table top. Have your elbows in line with the sides of your body.  Gently draw your shoulder blades down and toward your mid-back spine. Gradually increase the tension, without tensing the muscles along the top of your shoulders and the back of your neck.  Hold for __________ seconds. Slowly release the tension and relax your muscles completely before starting the next repetition.  After you have practiced this exercise, remove the arm support and complete the exercise in standing as well as sitting position. Repeat __________ times. Complete this exercise __________ times per day.  STRENGTH - Shoulder Abductors, Isometric  With good posture, stand or sit about 4-6 inches from a wall, with your right / left side facing the wall.  Bend your right / left elbow. Gently press your right / left elbow into the wall. Increase the pressure gradually, until you are pressing as hard as you can, without shrugging your shoulder or increasing any shoulder discomfort.  Hold for __________ seconds.  Release the tension slowly. Relax your shoulder muscles completely before you begin the next repetition. Repeat __________ times. Complete this exercise __________ times per day.  STRENGTH - External Rotators, Isometric  Keep your right / left elbow at your side and bend it 90 degrees.  Step into a door frame so that the outside of your right / left wrist can press against the door frame without your upper arm leaving your side.  Gently press your right / left wrist into the door frame, as if you were trying to swing the back of your hand away from your stomach. Gradually increase  the tension, until you are pressing as hard as you can, without shrugging your shoulder or increasing any shoulder discomfort.  Hold for __________ seconds.  Release the tension slowly. Relax your shoulder muscles completely before you begin the next repetition. Repeat __________ times. Complete this exercise __________ times per day.  STRENGTH - Supraspinatus   Stand or sit with good posture. Grasp a __________ weight, or an exercise band or tubing, so that your hand is "thumbs-up," like you are shaking hands.  Slowly lift your right / left arm in a "V" away from your thigh, diagonally into the space between your side and straight ahead. Lift your hand to shoulder height or as far as you can, without increasing any shoulder pain. At first, many people do not lift their hands above shoulder height.  Avoid shrugging your right / left shoulder as your arm rises, by keeping your shoulder blade tucked down and toward your mid-back spine.  Hold for __________ seconds. Control the descent of your hand, as you slowly return to your starting position. Repeat __________ times. Complete this exercise __________ times per day.  STRENGTH - External Rotators  Secure a rubber exercise band or tubing to a fixed object (table, pole) so that it is at the same height as your right / left elbow when you are standing or sitting on a firm surface.  Stand or sit so that the secured exercise band is at your uninjured side.  Bend your right / left elbow 90 degrees. Place a folded towel or small pillow under your right / left arm, so that your elbow is a few inches away from your side.  Keeping the tension on the exercise band, pull it away from your body, as if pivoting on your elbow. Be sure to keep your body steady, so that the movement is coming only from your rotating shoulder.  Hold for __________ seconds. Release the tension in a controlled manner, as you return to the starting position. Repeat __________  times. Complete this exercise __________ times per day.  STRENGTH - Internal Rotators   Secure a rubber exercise band or tubing to a fixed object (table, pole)  so that it is at the same height as your right / left elbow when you are standing or sitting on a firm surface.  Stand or sit so that the secured exercise band is at your right / left side.  Bend your elbow 90 degrees. Place a folded towel or small pillow under your right / left arm so that your elbow is a few inches away from your side.  Keeping the tension on the exercise band, pull it across your body, toward your stomach. Be sure to keep your body steady, so that the movement is coming only from your rotating shoulder.  Hold for __________ seconds. Release the tension in a controlled manner, as you return to the starting position. Repeat __________ times. Complete this exercise __________ times per day.  STRENGTH - Scapular Protractors, Standing   Stand arms length away from a wall. Place your hands on the wall, keeping your elbows straight.  Begin by dropping your shoulder blades down and toward your mid-back spine.  To strengthen your protractors, keep your shoulder blades down, but slide them forward on your rib cage. It will feel as if you are lifting the back of your rib cage away from the wall. This is a subtle motion and can be challenging to complete. Ask your caregiver for further instruction, if you are not sure you are doing the exercise correctly.  Hold for __________ seconds. Slowly return to the starting position, resting the muscles completely before starting the next repetition. Repeat __________ times. Complete this exercise __________ times per day. STRENGTH - Scapular Protractors, Supine  Lie on your back on a firm surface. Extend your right / left arm straight into the air while holding a __________ weight in your hand.  Keeping your head and back in place, lift your shoulder off the floor.  Hold for  __________ seconds. Slowly return to the starting position, and allow your muscles to relax completely before starting the next repetition. Repeat __________ times. Complete this exercise __________ times per day. STRENGTH - Scapular Protractors, Quadruped  Get onto your hands and knees, with your shoulders directly over your hands (or as close as you can be, comfortably).  Keeping your elbows locked, lift the back of your rib cage up into your shoulder blades, so your mid-back rounds out. Keep your neck muscles relaxed.  Hold this position for __________ seconds. Slowly return to the starting position and allow your muscles to relax completely before starting the next repetition. Repeat __________ times. Complete this exercise __________ times per day.  STRENGTH - Scapular Retractors  Secure a rubber exercise band or tubing to a fixed object (table, pole), so that it is at the height of your shoulders when you are either standing, or sitting on a firm armless chair.  With a palm down grip, grasp an end of the band in each hand. Straighten your elbows and lift your hands straight in front of you, at shoulder height. Step back, away from the secured end of the band, until it becomes tense.  Squeezing your shoulder blades together, draw your elbows back toward your sides, as you bend them. Keep your upper arms lifted away from your body throughout the exercise.  Hold for __________ seconds. Slowly ease the tension on the band, as you reverse the directions and return to the starting position. Repeat __________ times. Complete this exercise __________ times per day. STRENGTH - Shoulder Extensors   Secure a rubber exercise band or tubing to a fixed object (  table, pole) so that it is at the height of your shoulders when you are either standing, or sitting on a firm armless chair.  With a thumbs-up grip, grasp an end of the band in each hand. Straighten your elbows and lift your hands straight in  front of you, at shoulder height. Step back, away from the secured end of the band, until it becomes tense.  Squeezing your shoulder blades together, pull your hands down to the sides of your thighs. Do not allow your hands to go behind you.  Hold for __________ seconds. Slowly ease the tension on the band, as you reverse the directions and return to the starting position. Repeat __________ times. Complete this exercise __________ times per day.  STRENGTH - Scapular Retractors and External Rotators   Secure a rubber exercise band or tubing to a fixed object (table, pole) so that it is at the height as your shoulders, when you are either standing, or sitting on a firm armless chair.  With a palm down grip, grasp an end of the band in each hand. Bend your elbows 90 degrees and lift your elbows to shoulder height, at your sides. Step back, away from the secured end of the band, until it becomes tense.  Squeezing your shoulder blades together, rotate your shoulders so that your upper arms and elbows remain stationary, but your fists travel upward to head height.  Hold for __________ seconds. Slowly ease the tension on the band, as you reverse the directions and return to the starting position. Repeat __________ times. Complete this exercise __________ times per day.  STRENGTH - Scapular Retractors and External Rotators, Rowing   Secure a rubber exercise band or tubing to a fixed object (table, pole) so that it is at the height of your shoulders, when you are either standing, or sitting on a firm armless chair.  With a palm down grip, grasp an end of the band in each hand. Straighten your elbows and lift your hands straight in front of you, at shoulder height. Step back, away from the secured end of the band, until it becomes tense.  Step 1: Squeeze your shoulder blades together. Bending your elbows, draw your hands to your chest, as if you are rowing a boat. At the end of this motion, your hands  and elbow should be at shoulder height and your elbows should be out to your sides.  Step 2: Rotate your shoulders, to raise your hands above your head. Your forearms should be vertical and your upper arms should be horizontal.  Hold for __________ seconds. Slowly ease the tension on the band, as you reverse the directions and return to the starting position. Repeat __________ times. Complete this exercise __________ times per day.  STRENGTH - Scapular Depressors  Find a sturdy chair without wheels, such as a dining room chair.  Keeping your feet on the floor, and your hands on the chair arms, lift your bottom up from the seat, and lock your elbows.  Keeping your elbows straight, allow gravity to pull your body weight down. Your shoulders will rise toward your ears.  Raise your body against gravity by drawing your shoulder blades down your back, shortening the distance between your shoulders and ears. Although your feet should always maintain contact with the floor, your feet should progressively support less body weight, as you get stronger.  Hold for __________ seconds. In a controlled and slow manner, lower your body weight to begin the next repetition. Repeat __________  times. Complete this exercise __________ times per day.  Document Released: 12/08/2005 Document Revised: 03/01/2012 Document Reviewed: 03/22/2009 [facility] Patient Information 2015 [place], [place]. This information is not intended to replace advice given to you by your health care provider. Make sure you discuss any questions you have with your health care provider.

## 2014-11-07 NOTE — Telephone Encounter (Signed)
Spoke with patient about results and plan. Notified him that radiologist did not find evidence of fracture or dislocation. Advised that he can continue buddy taping as planned for the next 2 weeks. Symptoms should resolve within that time frame otherwise return to clinic if symptoms worsen, fail to resolve or as needed.  Dalton BambergMario Clemons Salvucci, PA-C [facility] [phone] 11/02/2014 9:53 AM

## 2015-02-07 ENCOUNTER — Ambulatory Visit (INDEPENDENT_AMBULATORY_CARE_PROVIDER_SITE_OTHER): Payer: BLUE CROSS/BLUE SHIELD

## 2015-02-07 ENCOUNTER — Ambulatory Visit (INDEPENDENT_AMBULATORY_CARE_PROVIDER_SITE_OTHER): Payer: BLUE CROSS/BLUE SHIELD | Admitting: Emergency Medicine

## 2015-02-07 VITALS — BP 128/74 | HR 85 | Temp 98.0°F | Resp 16 | Ht 66.0 in | Wt 144.8 lb

## 2015-02-07 DIAGNOSIS — F909 Attention-deficit hyperactivity disorder, unspecified type: Secondary | ICD-10-CM

## 2015-02-07 DIAGNOSIS — F419 Anxiety disorder, unspecified: Secondary | ICD-10-CM

## 2015-02-07 DIAGNOSIS — Z202 Contact with and (suspected) exposure to infections with a predominantly sexual mode of transmission: Secondary | ICD-10-CM

## 2015-02-07 DIAGNOSIS — F32A Depression, unspecified: Secondary | ICD-10-CM

## 2015-02-07 DIAGNOSIS — A630 Anogenital (venereal) warts: Secondary | ICD-10-CM

## 2015-02-07 DIAGNOSIS — M25371 Other instability, right ankle: Secondary | ICD-10-CM

## 2015-02-07 DIAGNOSIS — F988 Other specified behavioral and emotional disorders with onset usually occurring in childhood and adolescence: Secondary | ICD-10-CM

## 2015-02-07 DIAGNOSIS — F329 Major depressive disorder, single episode, unspecified: Secondary | ICD-10-CM

## 2015-02-07 MED ORDER — AMPHETAMINE-DEXTROAMPHETAMINE 20 MG PO TABS: ORAL_TABLET | ORAL | Status: DC

## 2015-02-07 MED ORDER — AMPHETAMINE-DEXTROAMPHETAMINE 20 MG PO TABS: 20.0000 mg | ORAL_TABLET | Freq: Every day | ORAL | Status: DC

## 2015-02-07 MED ORDER — PODOFILOX 0.5 % EX SOLN: CUTANEOUS | Status: DC

## 2015-02-07 MED ORDER — BUPROPION HCL ER (SR) 150 MG PO TB12: 150.0000 mg | ORAL_TABLET | Freq: Two times a day (BID) | ORAL | Status: DC

## 2015-02-07 NOTE — Progress Notes (Signed)
[facility] [address], [place] [address] [phone]  Date:  02/07/2015   Name:  Dalton Peters   DOB:  17-Sep-1971   MRN:  [id number]  PCP:  No PCP Per Patient    Chief Complaint: Follow-up; Medication Refill; and Penile Bumps   History of Present Illness:  Dalton Peters is a 44 y.o. very pleasant male patient who presents with the following:  History of an injury to his right ankle in November.  Ankle is now unstable and painful  Never evaluated Has two small soft tissue masses on the dorsal surface of the penis on the glans.   No urethral discharge or dysuria. Concerned he may have an STD. Needs refill of adderall Tolerating medical therapy of ADD well.  Appetite is stable.  Sleep is erratic due to his job. No improvement with over the counter medications or other home remedies.  Denies other complaint or health concern today.   Patient Active Problem List   Diagnosis Date Noted  . Depression 03/06/2014  . Attention deficit hyperactivity disorder (ADHD) 03/06/2014  . Gender identity disorder in adult 03/06/2014    Past Medical History  Diagnosis Date  . Anxiety   . Depression     History reviewed. No pertinent past surgical history.  History  Substance Use Topics  . Smoking status: Former Smoker    Quit date: 10/22/2013  . Smokeless tobacco: Not on file  . Alcohol Use: 1.2 - 1.8 oz/week    2-3 Standard drinks or equivalent per week    History reviewed. No pertinent family history.  No Known Allergies  Medication list has been reviewed and updated.  Current Outpatient Prescriptions on File Prior to Visit  Medication Sig Dispense Refill  . amphetamine-dextroamphetamine (ADDERALL) 20 MG tablet Take 1 tablet by mouth daily.  Take a second dose in the afternoon if needed.  Ok to fill 07/23/2014 40 tablet 0  . amphetamine-dextroamphetamine (ADDERALL) 20 MG tablet Take 1 tablet by mouth daily.  Take a second dose in the afternoon if  needed.  Ok to fill 08/22/2014 40 tablet 0  . amphetamine-dextroamphetamine (ADDERALL) 20 MG tablet Take 1 tablet (20 mg total) by mouth daily. Take a second dose in the afternoon if needed 40 tablet 0  . buPROPion (WELLBUTRIN SR) 150 MG 12 hr tablet Take 1 tablet (150 mg total) by mouth 2 (two) times daily. 60 tablet 1  . meloxicam (MOBIC) 15 MG tablet Take 1 tablet (15 mg total) by mouth daily. (Patient not taking: Reported on 02/07/2015) 30 tablet 1  . naproxen sodium (ANAPROX DS) 550 MG tablet Take 1 tablet (550 mg total) by mouth 2 (two) times daily with a meal. (Patient not taking: Reported on 02/07/2015) 60 tablet 1  . triamcinolone cream (KENALOG) 0.1 % Apply 1 application topically 2 (two) times daily. (Patient not taking: Reported on 02/07/2015) 60 g 0   No current facility-administered medications on file prior to visit.    Review of Systems:  As per HPI, otherwise negative.    Physical Examination: Filed Vitals:   02/07/15 1552  BP: 128/74  Pulse: 85  Temp: 98 F (36.7 C)  Resp: 16   Filed Vitals:   02/07/15 1552  Height:  (1.676 m)  Weight: 144 lb 12.8 oz (65.681 kg)   Body mass index is 23.38 kg/(m^2). Ideal Body Weight: Weight in (lb) to have BMI = 25: 154.6   GEN: WDWN, NAD, Non-toxic, Alert &  Oriented x 3 HEENT: Atraumatic, Normocephalic.  Ears and Nose: No external deformity. EXTR: No clubbing/cyanosis/edema NEURO: Normal gait.  PSYCH: Normally interactive. Conversant. Not depressed or anxious appearing.  Calm demeanor.  Genitalia:  Circumcised male.  tw0 soft tissue masses on dorsal glans  Assessment and Plan: Genital warts ADD STD exposure Ankle pain  Signed,  Phillips OdorJeffery Arleigh Odowd, MD

## 2015-02-07 NOTE — Patient Instructions (Signed)
Ankle Sprain An ankle sprain is an injury to the strong, fibrous tissues (ligaments) that hold the bones of your ankle joint together.  CAUSES An ankle sprain is usually caused by a fall or by twisting your ankle. Ankle sprains most commonly occur when you step on the outer edge of your foot, and your ankle turns inward. People who participate in sports are more prone to these types of injuries.  SYMPTOMS   Pain in your ankle. The pain may be present at rest or only when you are trying to stand or walk.  Swelling.  Bruising. Bruising may develop immediately or within 1 to 2 days after your injury.  Difficulty standing or walking, particularly when turning corners or changing directions. DIAGNOSIS  Your caregiver will ask you details about your injury and perform a physical exam of your ankle to determine if you have an ankle sprain. During the physical exam, your caregiver will press on and apply pressure to specific areas of your foot and ankle. Your caregiver will try to move your ankle in certain ways. An X-ray exam may be done to be sure a bone was not broken or a ligament did not separate from one of the bones in your ankle (avulsion fracture).  TREATMENT  Certain types of braces can help stabilize your ankle. Your caregiver can make a recommendation for this. Your caregiver may recommend the use of medicine for pain. If your sprain is severe, your caregiver may refer you to a surgeon who helps to restore function to parts of your skeletal system (orthopedist) or a physical therapist. [place] ice to your injury for 1-2 days or as directed by your caregiver. Applying ice helps to reduce inflammation and pain.  Put ice in a plastic bag.  Place a towel between your skin and the bag.  Leave the ice on for 15-20 minutes at a time, every 2 hours while you are awake.  Only take over-the-counter or prescription medicines for pain, discomfort, or fever as directed by  your caregiver.  Elevate your injured ankle above the level of your heart as much as possible for 2-3 days.  If your caregiver recommends crutches, use them as instructed. Gradually put weight on the affected ankle. Continue to use crutches or a cane until you can walk without feeling pain in your ankle.  If you have a plaster splint, wear the splint as directed by your caregiver. Do not rest it on anything harder than a pillow for the first 24 hours. Do not put weight on it. Do not get it wet. You may take it off to take a shower or bath.  You may have been given an elastic bandage to wear around your ankle to provide support. If the elastic bandage is too tight (you have numbness or tingling in your foot or your foot becomes cold and blue), adjust the bandage to make it comfortable.  If you have an air splint, you may blow more air into it or let air out to make it more comfortable. You may take your splint off at night and before taking a shower or bath. Wiggle your toes in the splint several times per day to decrease swelling. SEEK MEDICAL CARE IF:   You have rapidly increasing bruising or swelling.  Your toes feel extremely cold or you lose feeling in your foot.  Your pain is not relieved with medicine. SEEK IMMEDIATE MEDICAL CARE IF:  Your toes are numb or blue.  You have severe pain that is increasing. MAKE SURE YOU:   Understand these instructions.  Will watch your condition.  Will get help right away if you are not doing well or get worse. Document Released: 12/08/2005 Document Revised: 09/01/2012 Document Reviewed: 12/20/2011 [facility] Patient Information 2015 [place], [place]. This information is not intended to replace advice given to you by your health care provider. Make sure you discuss any questions you have with your health care provider. Genital Warts Genital warts are a sexually transmitted infection. They may appear as small bumps on the tissues of the genital  area. CAUSES  Genital warts are caused by a virus called human papillomavirus (HPV). HPV is the most common sexually transmitted disease (STD) and infection of the sex organs. This infection is spread by having unprotected sex with an infected person. It can be spread by vaginal, anal, and oral sex. Many people do not know they are infected. They may be infected for years without problems. However, even if they do not have problems, they can unknowingly pass the infection to their sexual partners. SYMPTOMS   Itching and irritation in the genital area.  Warts that bleed.  Painful sexual intercourse. DIAGNOSIS  Warts are usually recognized with the naked eye on the vagina, vulva, perineum, anus, and rectum. Certain tests can also diagnose genital warts, such as:  A Pap test.  A tissue sample (biopsy) exam.  Colposcopy. A magnifying tool is used to examine the vagina and cervix. The HPV cells will change color when certain solutions are used. TREATMENT  Warts can be removed by:  Applying certain chemicals, such as cantharidin or podophyllin.  Liquid nitrogen freezing (cryotherapy).  Immunotherapy with Candida or Trichophyton injections.  Laser treatment.  Burning with an electrified probe (electrocautery).  Interferon injections.  Surgery. PREVENTION  HPV vaccination can help prevent HPV infections that cause genital warts and that cause cancer of the cervix. It is recommended that the vaccination be given to people between the ages 54 to 61 years old. The vaccine might not work as well or might not work at all if you already have HPV. It should not be given to pregnant women. HOME CARE INSTRUCTIONS   It is important to follow your caregiver's instructions. The warts will not go away without treatment. Repeat treatments are often needed to get rid of warts. Even after it appears that the warts are gone, the normal tissue underneath often remains infected.  Do not try to treat  genital warts with medicine used to treat hand warts. This type of medicine is strong and can burn the skin in the genital area, causing more damage.  Tell your past and current sexual partner(s) that you have genital warts. They may be infected also and need treatment.  Avoid sexual contact while being treated.  Do not touch or scratch the warts. The infection may spread to other parts of your body.  Women with genital warts should have a cervical cancer check (Pap test) at least once a year. This type of cancer is slow-growing and can be cured if found early. Chances of developing cervical cancer are increased with HPV.  Inform your obstetrician about your warts in the event of pregnancy. This virus can be passed to the baby's respiratory tract. Discuss this with your caregiver.  Use a condom during sexual intercourse. Following treatment, the use of condoms will help prevent reinfection.  Ask your caregiver about using over-the-counter anti-itch creams. SEEK MEDICAL CARE IF:   Your treated  skin becomes red, swollen, or painful.  You have a fever.  You feel generally ill.  You feel little lumps in and around your genital area.  You are bleeding or have painful sexual intercourse. MAKE SURE YOU:   Understand these instructions.  Will watch your condition.  Will get help right away if you are not doing well or get worse. Document Released: 12/05/2000 Document Revised: 04/24/2014 Document Reviewed: 06/16/2011 [facility] Patient Information 2015 [place], [place]. This information is not intended to replace advice given to you by your health care provider. Make sure you discuss any questions you have with your health care provider.

## 2015-02-08 LAB — RPR

## 2015-02-08 LAB — GC/CHLAMYDIA PROBE AMP
CT Probe RNA: NEGATIVE
GC Probe RNA: NEGATIVE

## 2015-02-08 LAB — HSV(HERPES SIMPLEX VRS) I + II AB-IGG: HSV 2 Glycoprotein G Ab, IgG: <0.1 IV

## 2015-02-08 LAB — HIV ANTIBODY (ROUTINE TESTING W REFLEX): HIV: NONREACTIVE

## 2015-03-03 ENCOUNTER — Other Ambulatory Visit: Payer: Self-pay | Admitting: Family Medicine

## 2015-10-15 ENCOUNTER — Telehealth: Payer: Self-pay

## 2015-10-15 NOTE — Telephone Encounter (Signed)
Pt requesting adderall refill  Best phone for pt is [phone]

## 2015-10-16 NOTE — Telephone Encounter (Signed)
Pt.notified

## 2015-10-16 NOTE — Telephone Encounter (Signed)
Office visit only.

## 2015-10-17 ENCOUNTER — Ambulatory Visit (INDEPENDENT_AMBULATORY_CARE_PROVIDER_SITE_OTHER): Payer: BLUE CROSS/BLUE SHIELD | Admitting: Emergency Medicine

## 2015-10-17 VITALS — BP 114/64 | HR 94 | Temp 98.2°F | Resp 18 | Ht 67.0 in | Wt 155.0 lb

## 2015-10-17 DIAGNOSIS — F988 Other specified behavioral and emotional disorders with onset usually occurring in childhood and adolescence: Secondary | ICD-10-CM

## 2015-10-17 DIAGNOSIS — Z23 Encounter for immunization: Secondary | ICD-10-CM

## 2015-10-17 DIAGNOSIS — F909 Attention-deficit hyperactivity disorder, unspecified type: Secondary | ICD-10-CM

## 2015-10-17 MED ORDER — AMPHETAMINE-DEXTROAMPHETAMINE 20 MG PO TABS: ORAL_TABLET | ORAL | Status: DC

## 2015-10-17 MED ORDER — AMPHETAMINE-DEXTROAMPHETAMINE 20 MG PO TABS: 20.0000 mg | ORAL_TABLET | Freq: Every day | ORAL | Status: DC

## 2015-10-17 NOTE — Progress Notes (Signed)
Subjective:  Patient ID: Dalton Peters, male    DOB: [birth year]  Age: 44 y.o. MRN: [id number]  CC: Medication Refill and Flu Vaccine   HPI Dalton Peters presents  for refill on his Adderall. He tolerates medication well has no adverse effect of the medication. He is taking vacations from the medicine on weekends. He is eating and maintaining his weight and sleeping well and the current dose. He has no adverse effect of the medication or treatment.  History Dalton Peters has a past medical history of Anxiety and Depression.   He has no past surgical history on file.   His  family history is not on file.  He   reports that he quit smoking about 1 years ago. He does not have any smokeless tobacco history on file. He reports that he drinks about 1.2 - 1.8 oz of alcohol per week. He reports that he uses illicit drugs (Marijuana).  Outpatient Prescriptions Prior to Visit  Medication Sig Dispense Refill  . buPROPion (WELLBUTRIN SR) 150 MG 12 hr tablet Take 1 tablet (150 mg total) by mouth 2 (two) times daily. 60 tablet 5  . amphetamine-dextroamphetamine (ADDERALL) 20 MG tablet Take 1 tablet (20 mg total) by mouth daily. Take a second dose in the afternoon if needed 40 tablet 0  . amphetamine-dextroamphetamine (ADDERALL) 20 MG tablet Take 1 tablet by mouth daily.  Take a second dose in the afternoon if needed.  Ok to fill 04/08/15 40 tablet 0  . amphetamine-dextroamphetamine (ADDERALL) 20 MG tablet Take 1 tablet by mouth daily.  Take a second dose in the afternoon if needed.  Ok to fill 03/08/15 45 tablet 0  . meloxicam (MOBIC) 15 MG tablet Take 1 tablet (15 mg total) by mouth daily. (Patient not taking: Reported on 02/07/2015) 30 tablet 1  . podofilox (CONDYLOX) 0.5 % external solution Apply TID for three days then four days rest  Repeat for four cycles (Patient not taking: Reported on 10/17/2015) 3.5 mL 0  . triamcinolone cream (KENALOG) 0.1 % Apply 1 application topically 2 (two) times daily.  (Patient not taking: Reported on 02/07/2015) 60 g 0   No facility-administered medications prior to visit.    Social History   Social History  . Marital Status: Divorced    Spouse Name: N/A  . Number of Children: N/A  . Years of Education: N/A   Social History Main Topics  . Smoking status: Former Smoker    Quit date: 10/22/2013  . Smokeless tobacco: None  . Alcohol Use: 1.2 - 1.8 oz/week    2-3 Standard drinks or equivalent per week  . Drug Use: Yes    Special: Marijuana  . Sexual Activity: Not Asked   Other Topics Concern  . None   Social History Narrative     Review of Systems  Constitutional: Negative for fever, chills and appetite change.  HENT: Negative for congestion, ear pain, postnasal drip, sinus pressure and sore throat.   Eyes: Negative for pain and redness.  Respiratory: Negative for cough, shortness of breath and wheezing.   Cardiovascular: Negative for leg swelling.  Gastrointestinal: Negative for nausea, vomiting, abdominal pain, diarrhea, constipation and blood in stool.  Endocrine: Negative for polyuria.  Genitourinary: Negative for dysuria, urgency, frequency and flank pain.  Musculoskeletal: Negative for gait problem.  Skin: Negative for rash.  Neurological: Negative for weakness and headaches.  Psychiatric/Behavioral: Negative for confusion and decreased concentration. The patient is not nervous/anxious.     Objective:  BP  114/64 mmHg  Pulse 94  Temp(Src) 98.2 F (36.8 C) (Oral)  Resp 18  Ht 5\' 7"  (1.702 m)  Wt 155 lb (70.308 kg)  BMI 24.27 kg/m2  SpO2 99%  Physical Exam  Constitutional: He is oriented to person, place, and time. He appears well-developed and well-nourished.  HENT:  Head: Normocephalic and atraumatic.  Eyes: Conjunctivae are normal. Pupils are equal, round, and reactive to light.  Pulmonary/Chest: Effort normal.  Musculoskeletal: He exhibits no edema.  Neurological: He is alert and oriented to person, place, and time.   Skin: Skin is dry.  Psychiatric: He has a normal mood and affect. His behavior is normal. Thought content normal.      Assessment & Plan:   Dalton Peters was seen today for medication refill and flu vaccine.  Diagnoses and all orders for this visit:  Attention deficit hyperactivity disorder (ADHD), unspecified ADHD type  Needs flu shot -     Flu Vaccine QUAD 36+ mos IM  ADD (attention deficit disorder) -     amphetamine-dextroamphetamine (ADDERALL) 20 MG tablet; Take 1 tablet (20 mg total) by mouth daily. Take a second dose in the afternoon if needed -     amphetamine-dextroamphetamine (ADDERALL) 20 MG tablet; Take 1 tablet by mouth daily.  Take a second dose in the afternoon if needed.  Ok to fill 11/17/15 -     amphetamine-dextroamphetamine (ADDERALL) 20 MG tablet; Take 1 tablet by mouth daily.  Take a second dose in the afternoon if needed.  Ok to fill 12/17/15   I have discontinued Dalton Peters's triamcinolone cream, meloxicam, and podofilox. I have also changed his amphetamine-dextroamphetamine and amphetamine-dextroamphetamine. Additionally, I am having him maintain his buPROPion and amphetamine-dextroamphetamine.  Meds ordered this encounter  Medications  . amphetamine-dextroamphetamine (ADDERALL) 20 MG tablet    Sig: Take 1 tablet (20 mg total) by mouth daily. Take a second dose in the afternoon if needed    Dispense:  40 tablet    Refill:  0  . amphetamine-dextroamphetamine (ADDERALL) 20 MG tablet    Sig: Take 1 tablet by mouth daily.  Take a second dose in the afternoon if needed.  Ok to fill 11/17/15    Dispense:  40 tablet    Refill:  0  . amphetamine-dextroamphetamine (ADDERALL) 20 MG tablet    Sig: Take 1 tablet by mouth daily.  Take a second dose in the afternoon if needed.  Ok to fill 12/17/15    Dispense:  45 tablet    Refill:  0    Appropriate red flag conditions were discussed with the patient as well as actions that should be taken.  Patient expressed his  understanding.  Follow-up: Return if symptoms worsen or fail to improve.  Carmelina DaneAnderson, Mariesa Grieder S, MD

## 2015-10-17 NOTE — Patient Instructions (Addendum)
Attention Deficit Hyperactivity Disorder Attention deficit hyperactivity disorder (ADHD) is a problem with behavior issues based on the way the brain functions (neurobehavioral disorder). It is a common reason for behavior and academic problems in school. SYMPTOMS  There are 3 types of ADHD. The 3 types and some of the symptoms include:  Inattentive.  Gets bored or distracted easily.  Loses or forgets things. Forgets to hand in homework.  Has trouble organizing or completing tasks.  Difficulty staying on task.  An inability to organize daily tasks and school work.  Leaving projects, chores, or homework unfinished.  Trouble paying attention or responding to details. Careless mistakes.  Difficulty following directions. Often seems like is not listening.  Dislikes activities that require sustained attention (like chores or homework).  Hyperactive-impulsive.  Feels like it is impossible to sit still or stay in a seat. Fidgeting with hands and feet.  Trouble waiting turn.  Talking too much or out of turn. Interruptive.  Speaks or acts impulsively.  Aggressive, disruptive behavior.  Constantly busy or on the go; noisy.  Often leaves seat when they are expected to remain seated.  Often runs or climbs where it is not appropriate, or feels very restless.  Combined.  Has symptoms of both of the above. Often children with ADHD feel discouraged about themselves and with school. They often perform well below their abilities in school. As children get older, the excess motor activities can calm down, but the problems with paying attention and staying organized persist. Most children do not outgrow ADHD but with good treatment can learn to cope with the symptoms. DIAGNOSIS  When ADHD is suspected, the diagnosis should be made by professionals trained in ADHD. This professional will collect information about the individual suspected of having ADHD. Information must be collected from  various settings where the person lives, works, or attends school.  Diagnosis will include:  Confirming symptoms began in childhood.  Ruling out other reasons for the child's behavior.  The health care providers will check with the child's school and check their medical records.  They will talk to teachers and parents.  Behavior rating scales for the child will be filled out by those dealing with the child on a daily basis. A diagnosis is made only after all information has been considered. TREATMENT  Treatment usually includes behavioral treatment, tutoring or extra support in school, and stimulant medicines. Because of the way a person's brain works with ADHD, these medicines decrease impulsivity and hyperactivity and increase attention. This is different than how they would work in a person who does not have ADHD. Other medicines used include antidepressants and certain blood pressure medicines. Most experts agree that treatment for ADHD should address all aspects of the person's functioning. Along with medicines, treatment should include structured classroom management at school. Parents should reward good behavior, provide constant discipline, and set limits. Tutoring should be available for the child as needed. ADHD is a lifelong condition. If untreated, the disorder can have long-term serious effects into adolescence and adulthood. HOME CARE INSTRUCTIONS   Often with ADHD there is a lot of frustration among family members dealing with the condition. Blame and anger are also feelings that are common. In many cases, because the problem affects the family as a whole, the entire family may need help. A therapist can help the family find better ways to handle the disruptive behaviors of the person with ADHD and promote change. If the person with ADHD is young, most of the therapist's   work is with the parents. Parents will learn techniques for coping with and improving their child's behavior.  Sometimes only the child with the ADHD needs counseling. Your health care providers can help you make these decisions.  Children with ADHD may need help learning how to organize. Some helpful tips include:  Keep routines the same every day from wake-up time to bedtime. Schedule all activities, including homework and playtime. Keep the schedule in a place where the person with ADHD will often see it. Mark schedule changes as far in advance as possible.  Schedule outdoor and indoor recreation.  Have a place for everything and keep everything in its place. This includes clothing, backpacks, and school supplies.  Encourage writing down assignments and bringing home needed books. Work with your child's teachers for assistance in organizing school work.  Offer your child a well-balanced diet. Breakfast that includes a balance of whole grains, protein, and fruits or vegetables is especially important for school performance. Children should avoid drinks with caffeine including:  Soft drinks.  Coffee.  Tea.  However, some older children (adolescents) may find these drinks helpful in improving their attention. Because it can also be common for adolescents with ADHD to become addicted to caffeine, talk with your health care provider about what is a safe amount of caffeine intake for your child.  Children with ADHD need consistent rules that they can understand and follow. If rules are followed, give small rewards. Children with ADHD often receive, and expect, criticism. Look for good behavior and praise it. Set realistic goals. Give clear instructions. Look for activities that can foster success and self-esteem. Make time for pleasant activities with your child. Give lots of affection.  Parents are their children's greatest advocates. Learn as much as possible about ADHD. This helps you become a stronger and better advocate for your child. It also helps you educate your child's teachers and instructors  if they feel inadequate in these areas. Parent support groups are often helpful. A national group with local chapters is called Children and Adults with Attention Deficit Hyperactivity Disorder (CHADD). SEEK MEDICAL CARE IF:  Your child has repeated muscle twitches, cough, or speech outbursts.  Your child has sleep problems.  Your child has a marked loss of appetite.  Your child develops depression.  Your child has new or worsening behavioral problems.  Your child develops dizziness.  Your child has a racing heart.  Your child has stomach pains.  Your child develops headaches. SEEK IMMEDIATE MEDICAL CARE IF:  Your child has been diagnosed with depression or anxiety and the symptoms seem to be getting worse.  Your child has been depressed and suddenly appears to have increased energy or motivation.  You are worried that your child is having a bad reaction to a medication he or she is taking for ADHD.   This information is not intended to replace advice given to you by your health care provider. Make sure you discuss any questions you have with your health care provider.   Document Released: 11/28/2002 Document Revised: 12/13/2013 Document Reviewed: 08/15/2013 Elsevier Interactive Patient Education 2016 Elsevier Inc. Influenza Virus Vaccine injection (Fluarix) What is this medicine? INFLUENZA VIRUS VACCINE (in floo EN zuh VAHY ruhs vak SEEN) helps to reduce the risk of getting influenza also known as the flu. This medicine may be used for other purposes; ask your health care provider or pharmacist if you have questions. What should I tell my health care provider before I take this medicine? They  need to know if you have any of these conditions: -bleeding disorder like hemophilia -fever or infection -Guillain-Barre syndrome or other neurological problems -immune system problems -infection with the human immunodeficiency virus (HIV) or AIDS -low blood platelet  counts -multiple sclerosis -an unusual or allergic reaction to influenza virus vaccine, eggs, chicken proteins, latex, gentamicin, other medicines, foods, dyes or preservatives -pregnant or trying to get pregnant -breast-feeding How should I use this medicine? This vaccine is for injection into a muscle. It is given by a health care professional. A copy of Vaccine Information Statements will be given before each vaccination. Read this sheet carefully each time. The sheet may change frequently. Talk to your pediatrician regarding the use of this medicine in children. Special care may be needed. Overdosage: If you think you have taken too much of this medicine contact a poison control center or emergency room at once. NOTE: This medicine is only for you. Do not share this medicine with others. What if I miss a dose? This does not apply. What may interact with this medicine? -chemotherapy or radiation therapy -medicines that lower your immune system like etanercept, anakinra, infliximab, and adalimumab -medicines that treat or prevent blood clots like warfarin -phenytoin -steroid medicines like prednisone or cortisone -theophylline -vaccines This list may not describe all possible interactions. Give your health care provider a list of all the medicines, herbs, non-prescription drugs, or dietary supplements you use. Also tell them if you smoke, drink alcohol, or use illegal drugs. Some items may interact with your medicine. What should I watch for while using this medicine? Report any side effects that do not go away within 3 days to your doctor or health care professional. Call your health care provider if any unusual symptoms occur within 6 weeks of receiving this vaccine. You may still catch the flu, but the illness is not usually as bad. You cannot get the flu from the vaccine. The vaccine will not protect against colds or other illnesses that may cause fever. The vaccine is needed every  year. What side effects may I notice from receiving this medicine? Side effects that you should report to your doctor or health care professional as soon as possible: -allergic reactions like skin rash, itching or hives, swelling of the face, lips, or tongue Side effects that usually do not require medical attention (report to your doctor or health care professional if they continue or are bothersome): -fever -headache -muscle aches and pains -pain, tenderness, redness, or swelling at site where injected -weak or tired This list may not describe all possible side effects. Call your doctor for medical advice about side effects. You may report side effects to FDA at 1-800-FDA-1088. Where should I keep my medicine? This vaccine is only given in a clinic, pharmacy, doctor's office, or other health care setting and will not be stored at home. NOTE: This sheet is a summary. It may not cover all possible information. If you have questions about this medicine, talk to your doctor, pharmacist, or health care provider.    2016, Elsevier/Gold Standard. (2008-07-05 09:30:40)

## 2015-10-18 ENCOUNTER — Other Ambulatory Visit: Payer: Self-pay | Admitting: Emergency Medicine

## 2016-05-13 ENCOUNTER — Other Ambulatory Visit: Payer: Self-pay

## 2016-05-13 MED ORDER — BUPROPION HCL ER (SR) 150 MG PO TB12: ORAL_TABLET | ORAL | Status: DC

## 2016-07-03 ENCOUNTER — Ambulatory Visit (INDEPENDENT_AMBULATORY_CARE_PROVIDER_SITE_OTHER): Payer: BLUE CROSS/BLUE SHIELD | Admitting: Physician Assistant

## 2016-07-03 VITALS — BP 130/74 | HR 77 | Temp 98.7°F | Resp 16 | Ht 66.5 in | Wt 154.2 lb

## 2016-07-03 DIAGNOSIS — F909 Attention-deficit hyperactivity disorder, unspecified type: Secondary | ICD-10-CM | POA: Diagnosis not present

## 2016-07-03 DIAGNOSIS — Z23 Encounter for immunization: Secondary | ICD-10-CM

## 2016-07-03 DIAGNOSIS — F988 Other specified behavioral and emotional disorders with onset usually occurring in childhood and adolescence: Secondary | ICD-10-CM

## 2016-07-03 MED ORDER — AMPHETAMINE-DEXTROAMPHETAMINE 20 MG PO TABS: 10.0000 mg | ORAL_TABLET | Freq: Every day | ORAL | Status: DC

## 2016-07-03 MED ORDER — AMPHETAMINE-DEXTROAMPHETAMINE 20 MG PO TABS: 20.0000 mg | ORAL_TABLET | Freq: Every day | ORAL | Status: DC

## 2016-07-03 MED ORDER — BUPROPION HCL ER (SR) 150 MG PO TB12: ORAL_TABLET | ORAL | Status: DC

## 2016-07-03 NOTE — Progress Notes (Signed)
[facility] [address], [place] [address] [phone]  Date:  07/03/2016   Name:  Dalton Peters   DOB:  Jan 24, 1971   MRN:  [id number]  PCP:  No PCP Per Patient    History of Present Illness:  Dalton Peters is a 45 y.o. male patient who presents to [facility] for medication refill.    Wellbutrin: working well.  No side effects.  He has missed dosing occasionaly, but rare.    Adderrall: He has helped been using it as needed.  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   and taken half.  No si/hi.  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  He does not recall when his last immunization was.  Patient Active Problem List   Diagnosis Date Noted  . Depression 03/06/2014  . Attention deficit hyperactivity disorder (ADHD) 03/06/2014  . Gender identity disorder in adult 03/06/2014    Past Medical History  Diagnosis Date  . Anxiety   . Depression     History reviewed. No pertinent past surgical history.  Social History  Substance Use Topics  . Smoking status: Former Smoker    Quit date: 10/22/2013  . Smokeless tobacco: None  . Alcohol Use: 1.2 - 1.8 oz/week    2-3 Standard drinks or equivalent per week    History reviewed. No pertinent family history.  No Known Allergies  Medication list has been reviewed and updated.  Current Outpatient Prescriptions on File Prior to Visit  Medication Sig Dispense Refill  . amphetamine-dextroamphetamine (ADDERALL) 20 MG tablet Take 1 tablet (20 mg total) by mouth daily. Take a second dose in the afternoon if needed 40 tablet 0  . buPROPion (WELLBUTRIN SR) 150 MG 12 hr tablet TAKE 1 TABLET (150 MG TOTAL) BY MOUTH 2 (TWO) TIMES DAILY. 60 tablet 0  . amphetamine-dextroamphetamine (ADDERALL) 20 MG tablet Take 1 tablet by mouth daily.  Take a second dose in the afternoon if needed.  Ok to fill 11/17/15 40 tablet 0  . amphetamine-dextroamphetamine (ADDERALL) 20 MG tablet Take 1 tablet by mouth  daily.  Take a second dose in the afternoon if needed.  Ok to fill 12/17/15 45 tablet 0   No current facility-administered medications on file prior to visit.    ROS ROS otherwise unremarkable unless listed above.   Physical Examination: BP 130/74 mmHg  Pulse 77  Temp(Src) 98.7 F (37.1 C) (Oral)  Resp 16  Ht 5' 6.5" (1.689 m)  Wt 154 lb 3.2 oz (69.945 kg)  BMI 24.52 kg/m2  SpO2 98% Ideal Body Weight: Weight in (lb) to have BMI = 25: 156.9  Physical Exam  Constitutional: He is oriented to person, place, and time. He appears well-developed and well-nourished. No distress.  HENT:  Head: Normocephalic and atraumatic.  Eyes: Conjunctivae and EOM are normal. Pupils are equal, round, and reactive to light.  Cardiovascular: Normal rate.   Pulmonary/Chest: Effort normal. No respiratory distress.  Neurological: He is alert and oriented to person, place, and time.  Skin: Skin is warm and dry. He is not diaphoretic.  Psychiatric: He has a normal mood and affect. His behavior is normal.     Assessment and Plan: Dalton Peters is a 45 y.o. male who is here today refill of his adderall.  This was given today  Ok to fill for an additional 3 months. tdap given.  No records of last administration.    ADD (attention deficit  disorder) - Plan: amphetamine-dextroamphetamine (ADDERALL) 20 MG tablet, amphetamine-dextroamphetamine (ADDERALL) 20 MG tablet, amphetamine-dextroamphetamine (ADDERALL) 20 MG tablet, buPROPion (WELLBUTRIN SR) 150 MG 12 hr tablet, DISCONTINUED: amphetamine-dextroamphetamine (ADDERALL) 20 MG tablet  Need for Tdap vaccination - Plan: Tdap vaccine greater than or equal to 7yo IM   Trena PlattStephanie English, PA-C [facility] 07/03/2016 12:02 PM

## 2016-07-03 NOTE — Patient Instructions (Addendum)
     IF you received an x-ray today, you will receive an invoice from [facility]. Please contact [facility] at [phone] with questions or concerns regarding your invoice.   IF you received labwork today, you will receive an invoice from [facility]. Please contact [facility] at [phone] with questions or concerns regarding your invoice.   Our billing staff will not be able to assist you with questions regarding bills from these companies.  You will be contacted with the lab results as soon as they are available. The fastest way to get your results is to activate your My Chart account. Instructions are located on the last page of this paperwork. If you have not heard from us regarding the results in 2 weeks, please contact this office.    We will see you back in 6 months.

## 2016-07-30 ENCOUNTER — Other Ambulatory Visit: Payer: Self-pay

## 2016-07-30 DIAGNOSIS — F988 Other specified behavioral and emotional disorders with onset usually occurring in childhood and adolescence: Secondary | ICD-10-CM

## 2016-07-30 MED ORDER — BUPROPION HCL ER (SR) 150 MG PO TB12
ORAL_TABLET | ORAL | 1 refills | Status: DC
Start: 2016-07-30 — End: 2017-02-08

## 2016-12-27 IMAGING — CR DG ANKLE COMPLETE 3+V*R*
4 series · 4 of 4 positions shown · non-contrast
Comparison: None.

CLINICAL DATA: Right ankle pain and instability following an
untreated injury 3 months ago.

EXAM:
RIGHT ANKLE - COMPLETE 3+ VIEW

[AP]
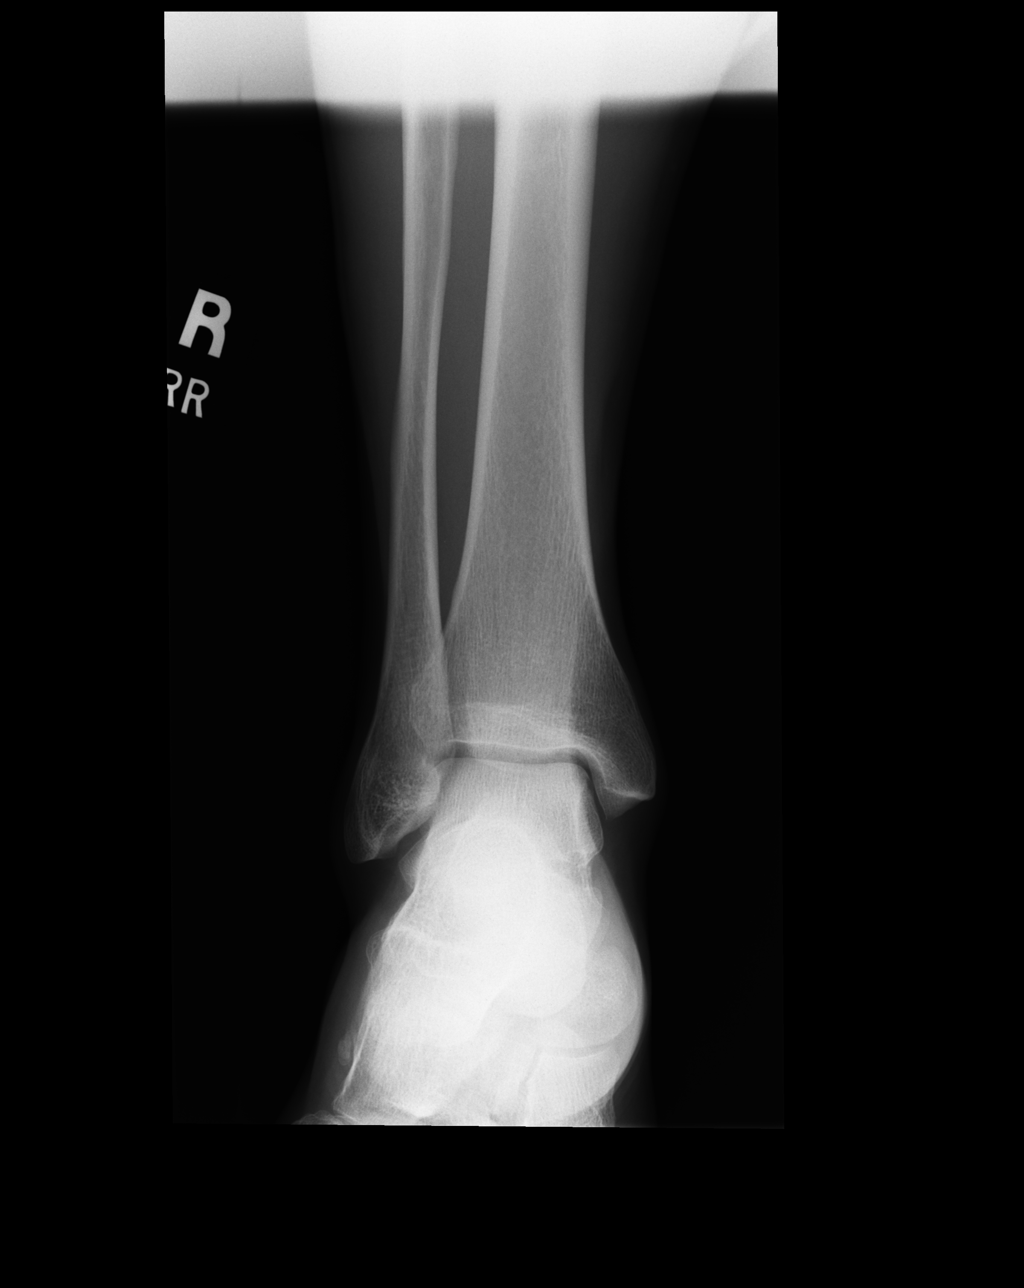

[ap obl int rot (1 of 2)]
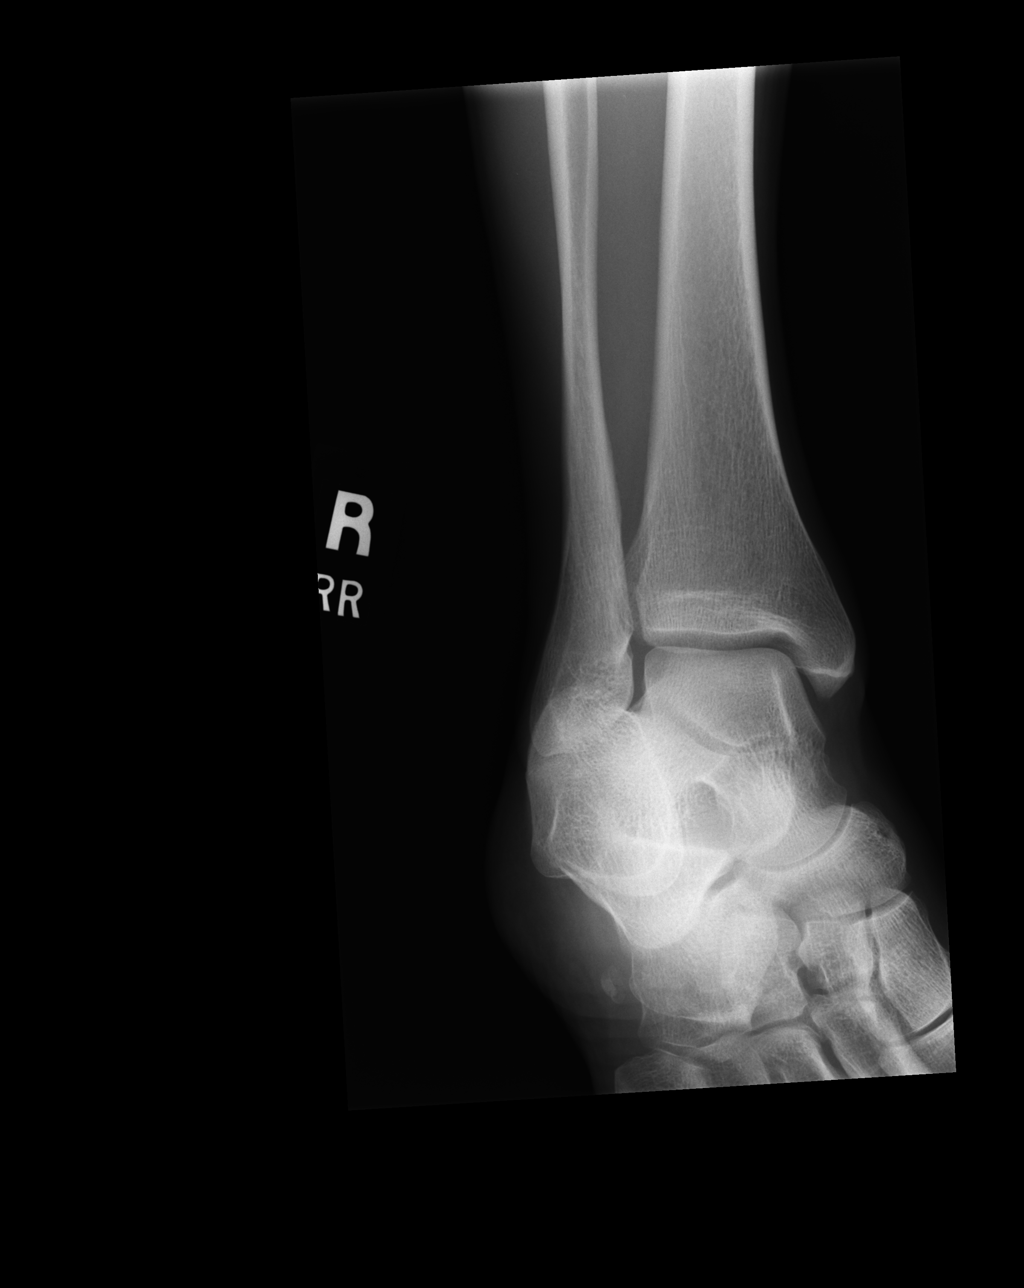

[ap obl int rot (2 of 2)]
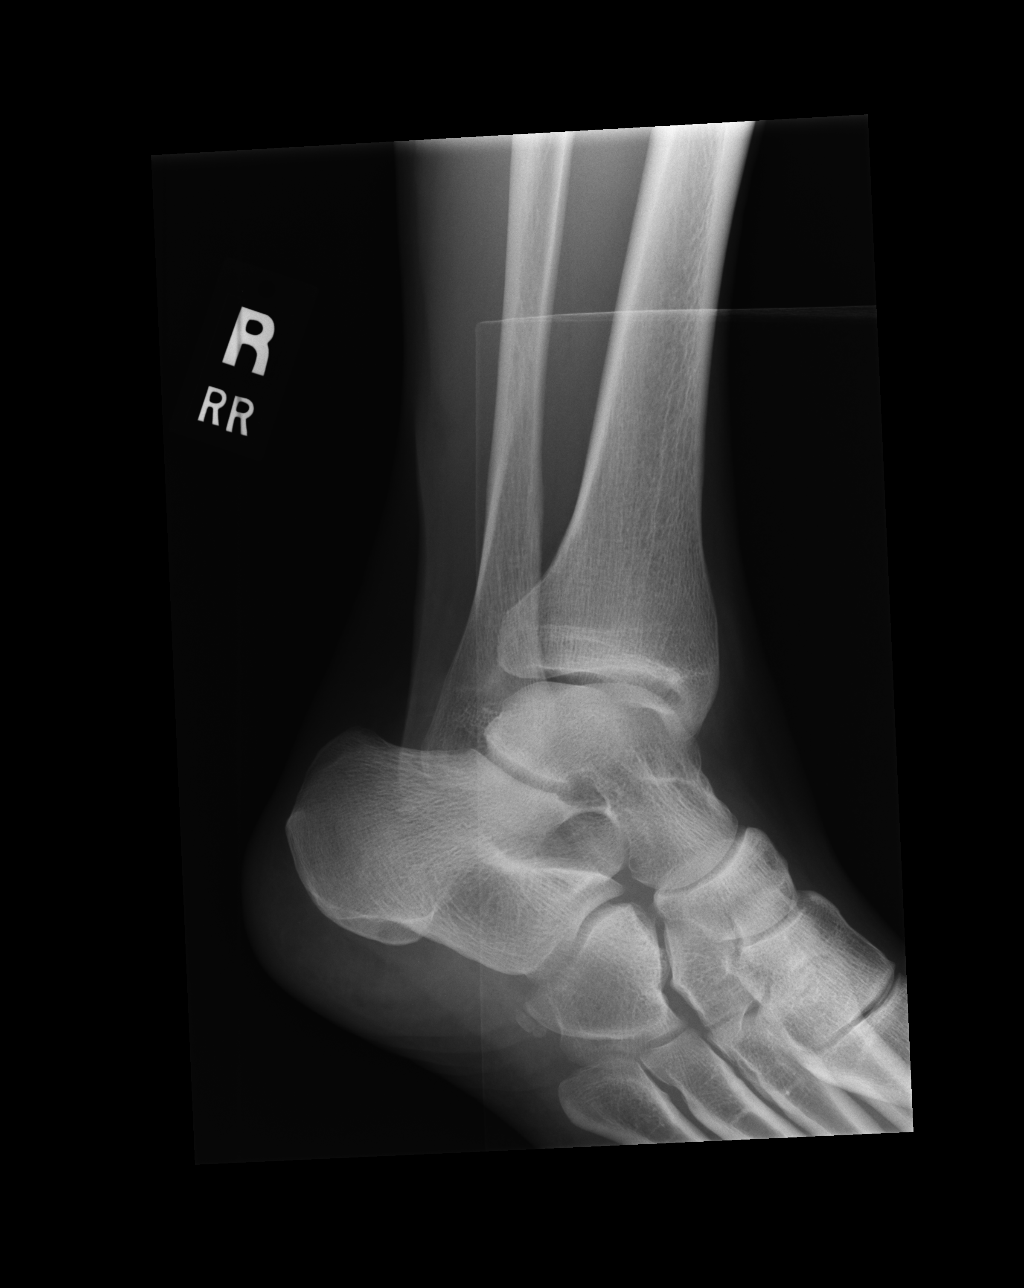

[lateral]
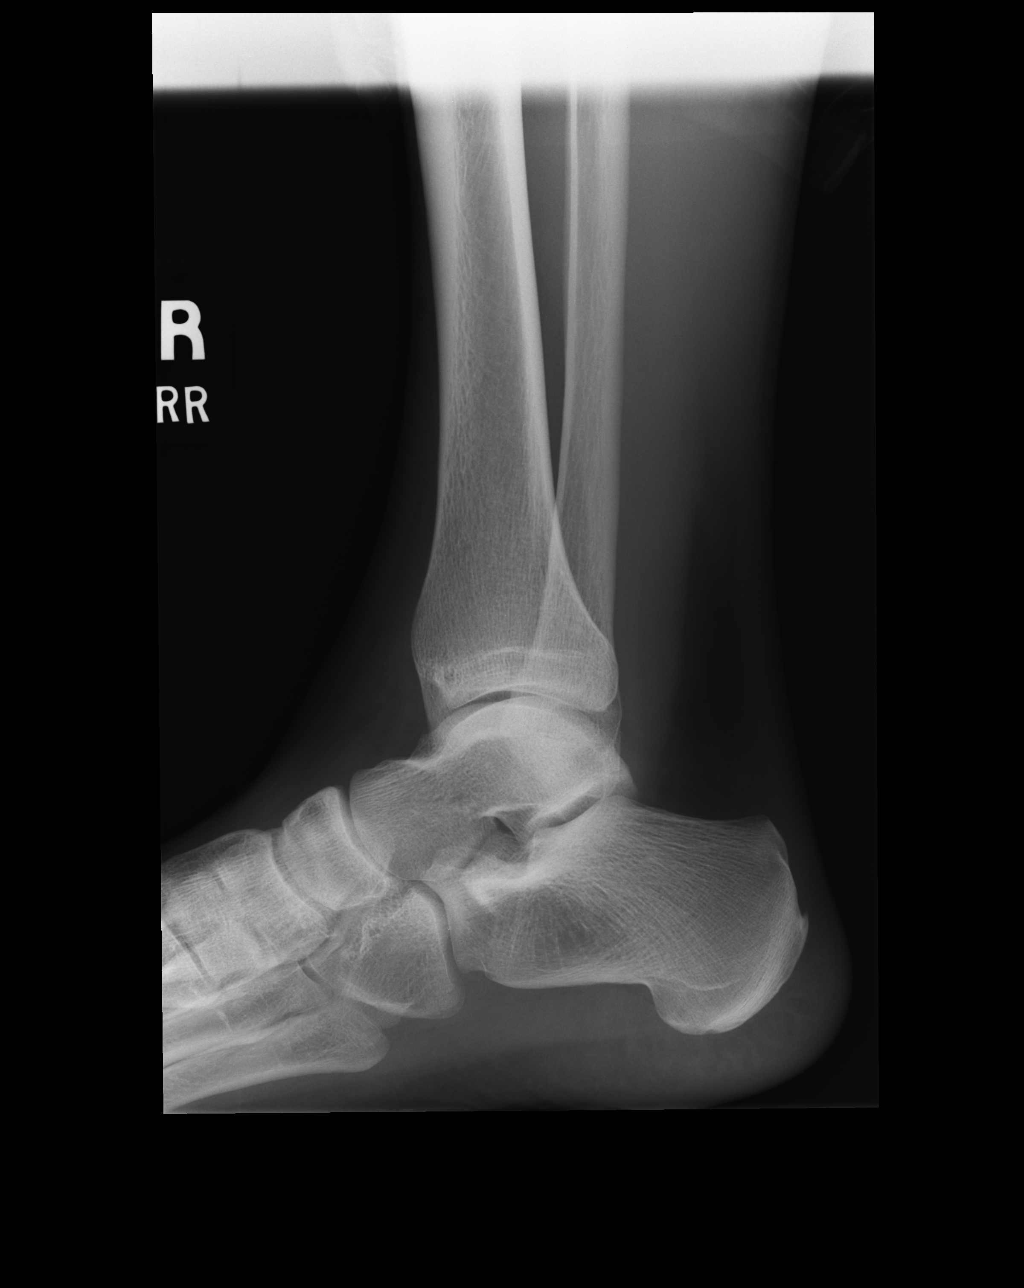

[4 of 4 positions shown; findings below may reference images not displayed]

FINDINGS: The lateral view is somewhat obliqued. No fracture or dislocation
seen.
IMPRESSION: Suboptimal examination with no fracture or dislocation seen.

## 2017-02-08 ENCOUNTER — Other Ambulatory Visit: Payer: Self-pay | Admitting: Physician Assistant

## 2017-02-08 DIAGNOSIS — F988 Other specified behavioral and emotional disorders with onset usually occurring in childhood and adolescence: Secondary | ICD-10-CM

## 2017-03-13 ENCOUNTER — Other Ambulatory Visit: Payer: Self-pay | Admitting: Physician Assistant

## 2017-03-13 DIAGNOSIS — F988 Other specified behavioral and emotional disorders with onset usually occurring in childhood and adolescence: Secondary | ICD-10-CM

## 2017-04-10 ENCOUNTER — Other Ambulatory Visit: Payer: Self-pay | Admitting: Physician Assistant

## 2017-04-10 DIAGNOSIS — F988 Other specified behavioral and emotional disorders with onset usually occurring in childhood and adolescence: Secondary | ICD-10-CM

## 2017-04-29 ENCOUNTER — Other Ambulatory Visit: Payer: Self-pay | Admitting: Physician Assistant

## 2017-04-29 DIAGNOSIS — F988 Other specified behavioral and emotional disorders with onset usually occurring in childhood and adolescence: Secondary | ICD-10-CM

## 2017-05-05 NOTE — Telephone Encounter (Signed)
Lm what is patient follow up plan for prescription refill.  He is overdue.

## 2017-05-07 ENCOUNTER — Encounter: Payer: Self-pay | Admitting: Physician Assistant

## 2017-05-07 ENCOUNTER — Ambulatory Visit (INDEPENDENT_AMBULATORY_CARE_PROVIDER_SITE_OTHER): Payer: BLUE CROSS/BLUE SHIELD | Admitting: Physician Assistant

## 2017-05-07 VITALS — BP 143/74 | HR 87 | Temp 98.0°F | Resp 18 | Ht 66.5 in | Wt 152.8 lb

## 2017-05-07 DIAGNOSIS — F411 Generalized anxiety disorder: Secondary | ICD-10-CM

## 2017-05-07 MED ORDER — BUPROPION HCL ER (SR) 150 MG PO TB12: ORAL_TABLET | ORAL | 5 refills | Status: DC

## 2017-05-07 NOTE — Patient Instructions (Signed)
     IF you received an x-ray today, you will receive an invoice from [facility]. Please contact [facility] at [phone] with questions or concerns regarding your invoice.   IF you received labwork today, you will receive an invoice from [facility]. Please contact [facility] at [phone] with questions or concerns regarding your invoice.   Our billing staff will not be able to assist you with questions regarding bills from these companies.  You will be contacted with the lab results as soon as they are available. The fastest way to get your results is to activate your My Chart account. Instructions are located on the last page of this paperwork. If you have not heard from us regarding the results in 2 weeks, please contact this office.     

## 2017-05-09 NOTE — Progress Notes (Signed)
[facility] [address], [place] [id number] [phone]  Date:  05/07/2017   Name:  Dalton Peters   DOB:  1971/01/25   MRN:  [id number]  PCP:  Patient, No Pcp Per    History of Present Illness:  Dalton Peters is a 46 y.o. male patient who presents to PCP with  Chief Complaint  Patient presents with  . Medication Refill    bupropion      Patient request refill of the bupropion.   [facility] He notes that he does feel that the anxiety is managed with this medication He stopped the adderall as this caused a lot of anxiety as he was taking this.  He feels that he is able to accomplish his duties without the medication--with work.   Patient Active Problem List   Diagnosis Date Noted  . Depression 03/06/2014  . Attention deficit hyperactivity disorder (ADHD) 03/06/2014  . Gender identity disorder in adult 03/06/2014    Past Medical History:  Diagnosis Date  . Anxiety   . Depression     No past surgical history on file.  Social History  Substance Use Topics  . Smoking status: Former Smoker    Quit date: 10/22/2013  . Smokeless tobacco: Never Used  . Alcohol use 1.2 - 1.8 oz/week    2 - 3 Standard drinks or equivalent per week    No family history on file.  No Known Allergies  Medication list has been reviewed and updated.  Current Outpatient Prescriptions on File Prior to Visit  Medication Sig Dispense Refill  . amphetamine-dextroamphetamine (ADDERALL) 20 MG tablet Take 0.5 tablets (10 mg total) by mouth daily. Fill after 30 days (Patient not taking: Reported on 05/07/2017) 15 tablet 0  . amphetamine-dextroamphetamine (ADDERALL) 20 MG tablet Take 0.5 tablets (10 mg total) by mouth daily. Fill after 60 days. (Patient not taking: Reported on 05/07/2017) 15 tablet 0  . amphetamine-dextroamphetamine (ADDERALL) 20 MG tablet Take 0.5 tablets (10 mg total) by mouth daily. (Patient not taking: Reported on 05/07/2017) 15 tablet 0   No  current facility-administered medications on file prior to visit.     ROS ROS otherwise unremarkable unless listed above.  Physical Examination: BP (!) 143/74   Pulse 87   Temp 98 F (36.7 C) (Oral)   Resp 18   Ht 5' 6.5" (1.689 m)   Wt 152 lb 12.8 oz (69.3 kg)   SpO2 98%   BMI 24.29 kg/m  Ideal Body Weight: Weight in (lb) to have BMI = 25: 156.9  Physical Exam  Constitutional: He is oriented to person, place, and time. He appears well-developed and well-nourished. No distress.  HENT:  Head: Normocephalic and atraumatic.  Eyes: Conjunctivae and EOM are normal. Pupils are equal, round, and reactive to light.  Cardiovascular: Normal rate.   Pulmonary/Chest: Effort normal. No respiratory distress.  Neurological: He is alert and oriented to person, place, and time.  Skin: Skin is warm and dry. He is not diaphoretic.  Psychiatric: He has a normal mood and affect. His behavior is normal.     Assessment and Plan: Dalton Peters is a 46 y.o. male who is here today for medication refill Generalized anxiety disorder - Plan: buPROPion (WELLBUTRIN SR) 150 MG 12 hr tablet  Trena PlattStephanie Levent Kornegay, PA-C [facility] 5/19/20189:24 AM

## 2017-06-03 NOTE — Telephone Encounter (Signed)
error 

## 2017-10-28 ENCOUNTER — Ambulatory Visit (INDEPENDENT_AMBULATORY_CARE_PROVIDER_SITE_OTHER): Payer: BLUE CROSS/BLUE SHIELD | Admitting: Physician Assistant

## 2017-10-28 ENCOUNTER — Encounter: Payer: Self-pay | Admitting: Physician Assistant

## 2017-10-28 VITALS — BP 108/78 | HR 77 | Temp 98.7°F | Resp 18 | Ht 66.5 in | Wt 155.4 lb

## 2017-10-28 DIAGNOSIS — Z130 Encounter for screening for diseases of the blood and blood-forming organs and certain disorders involving the immune mechanism: Secondary | ICD-10-CM | POA: Diagnosis not present

## 2017-10-28 DIAGNOSIS — Z125 Encounter for screening for malignant neoplasm of prostate: Secondary | ICD-10-CM | POA: Diagnosis not present

## 2017-10-28 DIAGNOSIS — Z0000 Encounter for general adult medical examination without abnormal findings: Secondary | ICD-10-CM | POA: Diagnosis not present

## 2017-10-28 DIAGNOSIS — R21 Rash and other nonspecific skin eruption: Secondary | ICD-10-CM | POA: Diagnosis not present

## 2017-10-28 DIAGNOSIS — Z13220 Encounter for screening for lipoid disorders: Secondary | ICD-10-CM

## 2017-10-28 DIAGNOSIS — J358 Other chronic diseases of tonsils and adenoids: Secondary | ICD-10-CM

## 2017-10-28 DIAGNOSIS — Z23 Encounter for immunization: Secondary | ICD-10-CM | POA: Diagnosis not present

## 2017-10-28 DIAGNOSIS — Z113 Encounter for screening for infections with a predominantly sexual mode of transmission: Secondary | ICD-10-CM

## 2017-10-28 DIAGNOSIS — Z13228 Encounter for screening for other metabolic disorders: Secondary | ICD-10-CM | POA: Diagnosis not present

## 2017-10-28 DIAGNOSIS — K602 Anal fissure, unspecified: Secondary | ICD-10-CM | POA: Diagnosis not present

## 2017-10-28 DIAGNOSIS — Z1329 Encounter for screening for other suspected endocrine disorder: Secondary | ICD-10-CM

## 2017-10-28 DIAGNOSIS — H9390 Unspecified disorder of ear, unspecified ear: Secondary | ICD-10-CM

## 2017-10-28 MED ORDER — TRIAMCINOLONE ACETONIDE 0.1 % EX CREA: 1.0000 "application " | TOPICAL_CREAM | Freq: Two times a day (BID) | CUTANEOUS | 0 refills | Status: DC

## 2017-10-28 NOTE — Patient Instructions (Addendum)
Do not use the cream for longer than 2 weeks.  Please return if the lesion does not improve.   Keeping you healthy  Get these tests  Blood pressure- Have your blood pressure checked once a year by your healthcare provider.  Normal blood pressure is 120/80.  Weight- Have your body mass index (BMI) calculated to screen for obesity.  BMI is a measure of body fat based on height and weight. You can also calculate your own BMI at https://www.west-esparza.com/www.nhlbisupport.com/bmi/.  Cholesterol- Have your cholesterol checked regularly starting at age 44, sooner may be necessary if you have diabetes, high blood pressure, if a family member developed heart diseases at an early age or if you smoke.   Chlamydia, HIV, and other sexual transmitted disease- Get screened each year until the age of 44 then within three months of each new sexual partner.  Diabetes- Have your blood sugar checked regularly if you have high blood pressure, high cholesterol, a family history of diabetes or if you are overweight.  Get these vaccines  Flu shot- Every fall.  Tetanus shot- Every 10 years.  Menactra- Single dose; prevents meningitis.  Take these steps  Don't smoke- If you do smoke, ask your healthcare provider about quitting. For tips on how to quit, go to www.smokefree.gov or call 1-800-QUIT-NOW.  Be physically active- Exercise 5 days a week for at least 30 minutes.  If you are not already physically active start slow and gradually work up to 30 minutes of moderate physical activity.  Examples of moderate activity include walking briskly, mowing the yard, dancing, swimming bicycling, etc.  Eat a healthy diet- Eat a variety of healthy foods such as fruits, vegetables, low fat milk, low fat cheese, yogurt, lean meats, poultry, fish, beans, tofu, etc.  For more information on healthy eating, go to www.thenutritionsource.org  Drink alcohol in moderation- Limit alcohol intake two drinks or less a day.  Never drink and drive.  Dentist-  Brush and floss teeth twice daily; visit your dentis twice a year.  Depression-Your emotional health is as important as your physical health.  If you're feeling down, losing interest in things you normally enjoy please talk with your healthcare provider.  Gun Safety- If you keep a gun in your home, keep it unloaded and with the safety lock on.  Bullets should be stored separately.  Helmet use- Always wear a helmet when riding a motorcycle, bicycle, rollerblading or skateboarding.  Safe sex- If you may be exposed to a sexually transmitted infection, use a condom  Seat belts- Seat bels can save your life; always wear one.  Smoke/Carbon Monoxide detectors- These detectors need to be installed on the appropriate level of your home.  Replace batteries at least once a year.  Skin Cancer- When out in the sun, cover up and use sunscreen SPF 15 or higher.  Violence- If anyone is threatening or hurting you, please tell your healthcare provider.  IF you received an x-ray today, you will receive an invoice from [facility]. Please contact [facility] at [phone] with questions or concerns regarding your invoice.   IF you received labwork today, you will receive an invoice from [facility]. Please contact [facility] at [phone] with questions or concerns regarding your invoice.   Our billing staff will not be able to assist you with questions regarding bills from these companies.  You will be contacted with the lab results as soon as they are available. The fastest way to get your results is to activate your  My Chart account. Instructions are located on the last page of this paperwork. If you have not heard from [place] regarding the results in 2 weeks, please contact this office.

## 2017-10-28 NOTE — Progress Notes (Signed)
[facility]IMARY C[facility] AT [facility] [address], [place] [address] [phone]  Date:  10/28/2017   Name:  Marchia Bondhomas M Sartwell   DOB:  [birth year]   MRN:  [id number]  PCP:  Garnetta BuddyEnglish, Gennett Garcia D, PA    History of Present Illness:  Marchia Bondhomas M Henricksen is a 46 y.o. male patient who presents to PCP with  Chief Complaint  Patient presents with  . Annual Exam       Patient Active Problem List   Diagnosis Date Noted  . Depression 03/06/2014  . Attention deficit hyperactivity disorder (ADHD) 03/06/2014  . Gender identity disorder in adult 03/06/2014    Past Medical History:  Diagnosis Date  . Anxiety   . Asthma   . Depression     History reviewed. No pertinent surgical history.  Social History   Tobacco Use  . Smoking status: Former Smoker    Last attempt to quit: 10/22/2013    Years since quitting: 4.0  . Smokeless tobacco: Never Used  Substance Use Topics  . Alcohol use: Yes    Alcohol/week: 1.2 - 1.8 oz    Types: 2 - 3 Standard drinks or equivalent per week  . Drug use: Yes    Types: Marijuana    Family History  Problem Relation Age of Onset  . Diabetes Father   . Hyperlipidemia Father     No Known Allergies  Medication list has been reviewed and updated.  Current Outpatient Medications on File Prior to Visit  Medication Sig Dispense Refill  . buPROPion (WELLBUTRIN SR) 150 MG 12 hr tablet TAKE 1 TABLET (150 MG TOTAL) BY MOUTH 2 (TWO) TIMES DAILY. 60 tablet 5  . amphetamine-dextroamphetamine (ADDERALL) 20 MG tablet Take 0.5 tablets (10 mg total) by mouth daily. Fill after 30 days (Patient not taking: Reported on 05/07/2017) 15 tablet 0  . amphetamine-dextroamphetamine (ADDERALL) 20 MG tablet Take 0.5 tablets (10 mg total) by mouth daily. Fill after 60 days. (Patient not taking: Reported on 05/07/2017) 15 tablet 0  . amphetamine-dextroamphetamine (ADDERALL) 20 MG tablet Take 0.5 tablets (10 mg total) by mouth daily. (Patient not taking: Reported on 05/07/2017) 15 tablet 0    No current facility-administered medications on file prior to visit.     ROS ROS otherwise unremarkable unless listed above.  Physical Examination: There were no vitals taken for this visit. Ideal Body Weight:    Physical Exam   Assessment and Plan: Marchia Bondhomas M Insco is a 46 y.o. male who is here today  There are no diagnoses linked to this encounter.  Trena PlattStephanie Khadar Monger, PA-C [facility] 10/28/2017 9:27 AM

## 2017-10-28 NOTE — Progress Notes (Signed)
PRIMARY CARE AT [facility] [address], [place] [address] [phone]  Date:  10/28/2017   Name:  Dalton Peters   DOB:  1971-05-02   MRN:  [id number]  PCP:  Joretta Bachelor, PA    History of Present Illness:  Dalton Peters is a 46 y.o. male patient who presents to PCP with  Chief Complaint  Patient presents with  . Annual Exam    ] DIET: mostly vegetarian.  Avoiding meat, occasionally fish.  Eating fish and dairy.  He eats a lot of breakfast cereal.  Eats more soy.  Water intake 48oz, and feels more tired when he is not hydrating well.  He is drinking sodas often in the week.  He was drinking monster energy drinks, but now has one once per week.   Caffeine:   BM: some normal.  Regular.  Periods of constipation when he is not having enough fiber, or water.  No black or bloody stool  URINATION: no dysuria, hematuria, or frequency.  No weakened stream.    SLEEP: irregular.  Schedule has him working really late at times, then earlier at times.  He will wake in the middle of the night.  Staying awake for hours.  Night mares rare  SOCIAL ACTIVITY:  Sleep.  Read, watch movies and television, walks.  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   Exercising:    Patient is concerned of rash along face.  This has been for several months.  More active in the dry weather.  He notes that he has had some redness along his scalp as well.  Uses tresemme conditioner and shampoo.  He does not moisturize.   He has this along the forehead between his nose.   He notes a lesion on his ear.  It has been there for decades.  It will agitate him at times.    Patient also complains of a cut along his anus.  At times this may bleed.  Constipation will raise it up.    Patient is also concerned of tonsil stones.  He has two nodules along his throat that have been present for about 20 years.  They are pockets that will harbor debris.  He can push them and a white discharge will come out that is malodorous.  He uses  a makeshift syringe at home and a paperclip to remove them.  Otherwise this creates halitosis.  He is concerned that this will never end, and would like referral to ear nose and throat.   Patient Active Problem List   Diagnosis Date Noted  . Depression 03/06/2014  . Attention deficit hyperactivity disorder (ADHD) 03/06/2014  . Gender identity disorder in adult 03/06/2014    Past Medical History:  Diagnosis Date  . Anxiety   . Asthma   . Depression     History reviewed. No pertinent surgical history.  Social History   Tobacco Use  . Smoking status: Former Smoker    Last attempt to quit: 10/22/2013    Years since quitting: 4.0  . Smokeless tobacco: Never Used  Substance Use Topics  . Alcohol use: Yes    Alcohol/week: 1.2 - 1.8 oz    Types: 2 - 3 Standard drinks or equivalent per week  . Drug use: Yes    Types: Marijuana    Family History  Problem Relation Age of Onset  . Diabetes Father   . Hyperlipidemia Father     No Known Allergies  Medication list has been reviewed  and updated.  Current Outpatient Medications on File Prior to Visit  Medication Sig Dispense Refill  . buPROPion (WELLBUTRIN SR) 150 MG 12 hr tablet TAKE 1 TABLET (150 MG TOTAL) BY MOUTH 2 (TWO) TIMES DAILY. 60 tablet 5  . amphetamine-dextroamphetamine (ADDERALL) 20 MG tablet Take 0.5 tablets (10 mg total) by mouth daily. Fill after 30 days (Patient not taking: Reported on 05/07/2017) 15 tablet 0  . amphetamine-dextroamphetamine (ADDERALL) 20 MG tablet Take 0.5 tablets (10 mg total) by mouth daily. Fill after 60 days. (Patient not taking: Reported on 05/07/2017) 15 tablet 0  . amphetamine-dextroamphetamine (ADDERALL) 20 MG tablet Take 0.5 tablets (10 mg total) by mouth daily. (Patient not taking: Reported on 05/07/2017) 15 tablet 0   No current facility-administered medications on file prior to visit.     ROS ROS otherwise unremarkable unless listed above.  Physical Examination: BP 108/78 (BP Location:  Left Arm, Patient Position: Sitting, Cuff Size: Normal)   Pulse 77   Temp 98.7 F (37.1 C) (Oral)   Resp 18   Ht 5' 6.5" (1.689 m)   Wt 155 lb 6.4 oz (70.5 kg)   SpO2 98%   BMI 24.71 kg/m  Ideal Body Weight: Weight in (lb) to have BMI = 25: 156.9  Physical Exam  Constitutional: He is oriented to person, place, and time. He appears well-developed and well-nourished. No distress.  HENT:  Head: Normocephalic and atraumatic.  Right Ear: Tympanic membrane, external ear and ear canal normal.  Left Ear: Tympanic membrane, external ear and ear canal normal.  Eyes: Conjunctivae and EOM are normal. Pupils are equal, round, and reactive to light.  Cardiovascular: Normal rate and regular rhythm. Exam reveals no friction rub.  No murmur heard. Pulmonary/Chest: Effort normal. No respiratory distress. He has no wheezes.  Abdominal: Soft. Bowel sounds are normal. He exhibits no distension and no mass. There is no tenderness.  Genitourinary:  Genitourinary Comments: Anal fissure at the 2 oclock region with very mild erythema and appears to be healed. Non-thrombosed hemorrhoid is also present at the 10 oclock region.   Mild erythema along the scalp line anteriorly, and between the eyebrows.  This is slightly dry and matted in texture.  No papules or purulence or lesions.    Musculoskeletal: Normal range of motion. He exhibits no edema or tenderness.  Neurological: He is alert and oriented to person, place, and time. He displays normal reflexes.  Skin: Skin is warm and dry. He is not diaphoretic.  Psychiatric: He has a normal mood and affect. His behavior is normal.     Assessment and Plan: Dalton Peters is a 46 y.o. male who is here today for cc of  Chief Complaint  Patient presents with  . Annual Exam  declines topical cream for the fissure.  Will manage diet better, with hydration and added fiber.  Tonsil stones, may need excision.  Placing referral at patient request.  Patient has done  many modalities, including gargling water pressure, for this.   Rash topical steroid.  Follow up if this has no improvement.  Advised no longer than 2 weeks.  Advised selsum blue for the hair. May need to consider topical steroid as well. Annual physical exam - Plan: Flu Vaccine QUAD 36+ mos IM, Ambulatory referral to ENT, CBC, CMP14+EGFR, TSH, RPR, HIV antibody, Lipid panel, PSA, GC/Chlamydia Probe Amp  Flu vaccine need - Plan: Flu Vaccine QUAD 36+ mos IM  Tonsil stone - Plan: Ambulatory referral to ENT  Lesion of ear -  Plan: Ambulatory referral to ENT  Screening for deficiency anemia - Plan: CBC  Screening for metabolic disorder - Plan: CMP14+EGFR  Screening for thyroid disorder - Plan: TSH  Screening for prostate cancer - Plan: PSA  Screening for STD (sexually transmitted disease) - Plan: RPR, HIV antibody, GC/Chlamydia Probe Amp  Screening for lipid disorders - Plan: Lipid panel  Rash - Plan: triamcinolone cream (KENALOG) 0.1 %  Anal fissure  Ivar Drape, PA-C [facility] 11/7/20181:56 PM

## 2017-10-29 LAB — CBC
Hematocrit: 50.2 % (ref 37.5–51.0)
Hemoglobin: 17.4 g/dL (ref 13.0–17.7)
MCH: 30.8 pg (ref 26.6–33.0)
MCHC: 34.7 g/dL (ref 31.5–35.7)
MCV: 89 fL (ref 79–97)
PLATELETS: 321 10*3/uL (ref 150–379)
RBC: 5.65 x10E6/uL (ref 4.14–5.80)
RDW: 12.9 % (ref 12.3–15.4)
WBC: 6.4 10*3/uL (ref 3.4–10.8)

## 2017-10-29 LAB — CMP14+EGFR
ALK PHOS: 66 IU/L (ref 39–117)
ALT: 60 IU/L — AB (ref 0–44)
AST: 40 IU/L (ref 0–40)
Albumin/Globulin Ratio: 1.8 (ref 1.2–2.2)
Albumin: 4.9 g/dL (ref 3.5–5.5)
BILIRUBIN TOTAL: 0.8 mg/dL (ref 0.0–1.2)
BUN / CREAT RATIO: 13 (ref 9–20)
BUN: 13 mg/dL (ref 6–24)
CHLORIDE: 98 mmol/L (ref 96–106)
CO2: 28 mmol/L (ref 20–29)
Calcium: 10 mg/dL (ref 8.7–10.2)
Creatinine, Ser: 1.03 mg/dL (ref 0.76–1.27)
GFR calc non Af Amer: 89 mL/min/{1.73_m2} (ref >59)
GFR, EST AFRICAN AMERICAN: 102 mL/min/{1.73_m2} (ref >59)
GLUCOSE: 101 mg/dL — AB (ref 65–99)
Globulin, Total: 2.7 g/dL (ref 1.5–4.5)
POTASSIUM: 5 mmol/L (ref 3.5–5.2)
Sodium: 140 mmol/L (ref 134–144)
TOTAL PROTEIN: 7.6 g/dL (ref 6.0–8.5)

## 2017-10-29 LAB — LIPID PANEL
CHOLESTEROL TOTAL: 184 mg/dL (ref 100–199)
Chol/HDL Ratio: 4.4 ratio (ref 0.0–5.0)
HDL: 42 mg/dL (ref >39)
LDL CALC: 112 mg/dL — AB (ref 0–99)
Triglycerides: 149 mg/dL (ref 0–149)
VLDL CHOLESTEROL CAL: 30 mg/dL (ref 5–40)

## 2017-10-29 LAB — TSH: TSH: 2.53 u[IU]/mL (ref 0.450–4.500)

## 2017-10-29 LAB — RPR: RPR Ser Ql: NONREACTIVE

## 2017-10-29 LAB — HIV ANTIBODY (ROUTINE TESTING W REFLEX): HIV SCREEN 4TH GENERATION: NONREACTIVE

## 2017-10-29 LAB — PSA: Prostate Specific Ag, Serum: 0.4 ng/mL (ref 0.0–4.0)

## 2017-10-30 LAB — GC/CHLAMYDIA PROBE AMP
CHLAMYDIA, DNA PROBE: NEGATIVE
NEISSERIA GONORRHOEAE BY PCR: NEGATIVE

## 2017-11-23 ENCOUNTER — Other Ambulatory Visit: Payer: Self-pay | Admitting: Physician Assistant

## 2017-11-23 DIAGNOSIS — F411 Generalized anxiety disorder: Secondary | ICD-10-CM

## 2018-03-31 ENCOUNTER — Encounter: Payer: Self-pay | Admitting: Physician Assistant

## 2018-07-27 ENCOUNTER — Encounter: Payer: Self-pay | Admitting: Urgent Care

## 2018-07-27 ENCOUNTER — Ambulatory Visit: Payer: BLUE CROSS/BLUE SHIELD | Admitting: Urgent Care

## 2018-07-27 VITALS — BP 131/93 | HR 86 | Temp 98.4°F | Resp 16 | Ht 66.5 in | Wt 159.6 lb

## 2018-07-27 DIAGNOSIS — Z8659 Personal history of other mental and behavioral disorders: Secondary | ICD-10-CM | POA: Diagnosis not present

## 2018-07-27 DIAGNOSIS — F988 Other specified behavioral and emotional disorders with onset usually occurring in childhood and adolescence: Secondary | ICD-10-CM

## 2018-07-27 DIAGNOSIS — F32A Depression, unspecified: Secondary | ICD-10-CM

## 2018-07-27 DIAGNOSIS — F329 Major depressive disorder, single episode, unspecified: Secondary | ICD-10-CM | POA: Diagnosis not present

## 2018-07-27 MED ORDER — BUPROPION HCL ER (SR) 150 MG PO TB12: ORAL_TABLET | ORAL | 5 refills | Status: DC

## 2018-07-27 NOTE — Patient Instructions (Addendum)
Bupropion sustained-release tablets (Depression/Mood Disorders)  What is this medicine?  BUPROPION (byoo PROE pee on) is used to treat depression.  This medicine may be used for other purposes; ask your health care provider or pharmacist if you have questions.  COMMON BRAND NAME(S): Budeprion SR, Wellbutrin SR  What should I tell my health care provider before I take this medicine?  They need to know if you have any of these conditions:  -an eating disorder, such as anorexia or bulimia  -bipolar disorder or psychosis  -diabetes or high blood sugar, treated with medication  -glaucoma  -head injury or brain tumor  -heart disease, previous heart attack, or irregular heart beat  -high blood pressure  -kidney or liver disease  -seizures  -suicidal thoughts or a previous suicide attempt  -Tourette's syndrome  -weight loss  -an unusual or allergic reaction to bupropion, other medicines, foods, dyes, or preservatives  -breast-feeding  -pregnant or trying to become pregnant  How should I use this medicine?  Take this medicine by mouth with a glass of water. Follow the directions on the prescription label. You can take it with or without food. If it upsets your stomach, take it with food. Do not cut, crush or chew this medicine. Take your medicine at regular intervals. If you take this medicine more than once a day, take your second dose at least 8 hours after you take your first dose. To limit difficulty in sleeping, avoid taking this medicine at bedtime. Do not take your medicine more often than directed. Do not stop taking this medicine suddenly except upon the advice of your doctor. Stopping this medicine too quickly may cause serious side effects or your condition may worsen.  A special MedGuide will be given to you by the pharmacist with each prescription and refill. Be sure to read this information carefully each time.  Talk to your pediatrician regarding the use of this medicine in children. Special care may be  needed.  Overdosage: If you think you have taken too much of this medicine contact a poison control center or emergency room at once.  NOTE: This medicine is only for you. Do not share this medicine with others.  What if I miss a dose?  If you miss a dose, skip the missed dose and take your next tablet at the regular time. There should be at least 8 hours between doses. Do not take double or extra doses.  What may interact with this medicine?  Do not take this medicine with any of the following medications:  -linezolid  -MAOIs like Azilect, Carbex, Eldepryl, Marplan, Nardil, and Parnate  -methylene blue (injected into a vein)  -other medicines that contain bupropion like Zyban  This medicine may also interact with the following medications:  -alcohol  -certain medicines for anxiety or sleep  -certain medicines for blood pressure like metoprolol, propranolol  -certain medicines for depression or psychotic disturbances  -certain medicines for HIV or AIDS like efavirenz, lopinavir, nelfinavir, ritonavir  -certain medicines for irregular heart beat like propafenone, flecainide  -certain medicines for Parkinson's disease like amantadine, levodopa  -certain medicines for seizures like carbamazepine, phenytoin, phenobarbital  -cimetidine  -clopidogrel  -cyclophosphamide  -digoxin  -furazolidone  -isoniazid  -nicotine  -orphenadrine  -procarbazine  -steroid medicines like prednisone or cortisone  -stimulant medicines for attention disorders, weight loss, or to stay awake  -tamoxifen  -theophylline  -thiotepa  -ticlopidine  -tramadol  -warfarin  This list may not describe all possible interactions. Give your   your doctor or health care professional for regular checks on your progress. Because it may take several weeks to see the full effects of this medicine, it is important to continue your treatment as prescribed by your doctor. Patients and their families should watch out for new or worsening thoughts of suicide or depression. Also watch out for sudden changes in feelings such as feeling anxious, agitated, panicky, irritable, hostile, aggressive, impulsive, severely restless, overly excited and hyperactive, or not being able to sleep. If this happens, especially at the beginning of treatment or after a change in dose, call your health care professional. Avoid alcoholic drinks while taking this medicine. Drinking excessive alcoholic beverages, using sleeping or anxiety medicines, or quickly stopping the use of these agents while taking this medicine may increase your risk for a seizure. Do not drive or use heavy machinery until you know how this medicine affects you. This medicine can impair your ability to perform these tasks. Do not take this medicine close to bedtime. It may prevent you from sleeping. Your mouth may get dry. Chewing sugarless gum or sucking hard candy, and drinking plenty of water may help. Contact your doctor if the problem does not go away or is severe. What side effects may I notice from receiving this medicine? Side effects that you should report to your doctor or health care professional as soon as possible: -allergic reactions like skin rash, itching or hives, swelling of the face, lips, or tongue -breathing problems -changes in vision -confusion -elevated mood, decreased need for sleep, racing thoughts, impulsive behavior -fast or irregular heartbeat -hallucinations, loss of contact with reality -increased blood pressure -redness, blistering, peeling or loosening of the skin, including inside the mouth -seizures -suicidal thoughts or other  mood changes -unusually weak or tired -vomiting Side effects that usually do not require medical attention (report to your doctor or health care professional if they continue or are bothersome): -constipation -headache -loss of appetite -nausea -tremors -weight loss This list may not describe all possible side effects. Call your doctor for medical advice about side effects. You may report side effects to FDA at 1-800-FDA-1088. Where should I keep my medicine? Keep out of the reach of children. Store at room temperature between 20 and 25 degrees C (68 and 77 degrees F), away from direct sunlight and moisture. Keep tightly closed. Throw away any unused medicine after the expiration date. NOTE: This sheet is a summary. It may not cover all possible information. If you have questions about this medicine, talk to your doctor, pharmacist, or health care provider.  2018 Elsevier/Gold Standard (2016-05-30 13:52:19)     IF you received an x-ray today, you will receive an invoice from [facility]. Please contact [facility] at [phone] with questions or concerns regarding your invoice.   IF you received labwork today, you will receive an invoice from [facility]. Please contact [facility] at [phone] with questions or concerns regarding your invoice.   Our billing staff will not be able to assist you with questions regarding bills from these companies.  You will be contacted with the lab results as soon as they are available. The fastest way to get your results is to activate your My Chart account. Instructions are located on the last page of this paperwork. If you have not heard from us regarding the results in 2 weeks, please contact this office.

## 2018-07-27 NOTE — Progress Notes (Signed)
    MRN: [id number] DOB: 03-Jun-1971  Subjective:   Dalton Peters is a 47 y.o. male presenting for . 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 Sleeps 6-7 hours, hours for getting to sleep are difficult to maintain. Denies SI, HI. Has occasional thoughts about being weary of life. He is going to be moving soon into a better living situation. Has good relationships at home, has a good support network. Denies smoking cigarettes. Has occassional alcohol drink.   Dalton Peters has a current medication list which includes the following prescription(s): bupropion. Also has No Known Allergies.  Dalton Peters  has a past medical history of Anxiety, Asthma, and Depression. Denies past surgical history.   Objective:   Vitals: BP (!) 131/93   Pulse 86   Temp 98.4 F (36.9 C) (Oral)   Resp 16   Ht 5' 6.5" (1.689 m)   Wt 159 lb 9.6 oz (72.4 kg)   SpO2 98%   BMI 25.37 kg/m   Physical Exam  Constitutional: He is oriented to person, place, and time. He appears well-developed and well-nourished.  Cardiovascular: Normal rate.  Pulmonary/Chest: Effort normal.  Neurological: He is alert and oriented to person, place, and time.  Psychiatric: His mood appears not anxious. His affect is not blunt and not labile. His speech is not rapid and/or pressured, not delayed, not tangential and not slurred. He is not agitated, not aggressive, not hyperactive, not slowed, not withdrawn and not combative. He does not exhibit a depressed mood. He expresses no homicidal and no suicidal ideation. He is communicative.   Assessment and Plan :   Attention deficit disorder, unspecified hyperactivity presence - Plan: buPROPion (WELLBUTRIN SR) 150 MG 12 hr tablet  Depression, unspecified depression type  History of anxiety  Patient is doing very well with his Wellbutrin.  He does have some lingering difficulty with his sleep due to his work/lifestyle.   He will try to contact his therapist to set up a follow-up appointment.  For now he does like to stay on Wellbutrin and continue some melatonin as needed for his sleep.  He is not due for any labs at this time.  We will follow-up in 6 months.  Dalton BambergMario Witt Plitt, PA-C Primary Care at [facility] [phone] 07/27/2018  9:16 AM

## 2018-08-18 ENCOUNTER — Other Ambulatory Visit: Payer: Self-pay | Admitting: Urgent Care

## 2018-08-18 DIAGNOSIS — F988 Other specified behavioral and emotional disorders with onset usually occurring in childhood and adolescence: Secondary | ICD-10-CM

## 2018-08-18 NOTE — Telephone Encounter (Signed)
Refill of Wellbutrin SR  Pharmacy request 90 days prescription.  DX code needed  LOV 07/27/18 M. Mani  [facility]  #60  5 refills

## 2018-08-26 ENCOUNTER — Telehealth: Payer: Self-pay | Admitting: Urgent Care

## 2018-08-26 NOTE — Telephone Encounter (Signed)
Called pt. To reschedule their appt. With PA mani. Left detailed VM  ° °

## 2019-01-21 ENCOUNTER — Other Ambulatory Visit: Payer: Self-pay | Admitting: Urgent Care

## 2019-01-21 ENCOUNTER — Other Ambulatory Visit: Payer: Self-pay | Admitting: Family Medicine

## 2019-01-21 DIAGNOSIS — F988 Other specified behavioral and emotional disorders with onset usually occurring in childhood and adolescence: Secondary | ICD-10-CM

## 2019-01-21 MED ORDER — BUPROPION HCL ER (SR) 150 MG PO TB12: 150.0000 mg | ORAL_TABLET | Freq: Two times a day (BID) | ORAL | 0 refills | Status: AC

## 2019-01-21 NOTE — Telephone Encounter (Signed)
Pt has appt 02/16/2019 Dr. Neva SeatGreene

## 2019-01-21 NOTE — Telephone Encounter (Signed)
Copied from CRM [id number]. Topic: Quick Communication - Rx Refill/Question >> Jan 21, 2019  1:40 PM Jaquita Rector A wrote: Medication: buPROPion (WELLBUTRIN SR) 150 MG 12 hr tablet  Has the patient contacted their pharmacy? Yes.   (Agent: If no, request that the patient contact the pharmacy for the refill.) (Agent: If yes, when and what did the pharmacy advise?)  Preferred Pharmacy (with phone number or street name): [id number] - [place], [place] - [address] [phone] (Phone) [phone] (Fax)    Agent: Please be advised that RX refills may take up to 3 business days. We ask that you follow-up with your pharmacy.

## 2019-01-28 ENCOUNTER — Ambulatory Visit: Payer: BLUE CROSS/BLUE SHIELD | Admitting: Urgent Care

## 2019-02-16 ENCOUNTER — Ambulatory Visit: Payer: BLUE CROSS/BLUE SHIELD | Admitting: Family Medicine

## 2019-10-15 ENCOUNTER — Encounter (INDEPENDENT_AMBULATORY_CARE_PROVIDER_SITE_OTHER): Payer: Self-pay
